# Patient Record
Sex: Female | Born: 1960 | Race: Black or African American | Hispanic: No | Marital: Married | Smoking: Former smoker
Health system: Southern US, Community
[De-identification: ages and names within clinical notes are randomized; demographics above are authoritative.]

## PROBLEM LIST (undated history)

## (undated) DIAGNOSIS — J45909 Unspecified asthma, uncomplicated: Secondary | ICD-10-CM

## (undated) DIAGNOSIS — T7840XA Allergy, unspecified, initial encounter: Secondary | ICD-10-CM

## (undated) DIAGNOSIS — A630 Anogenital (venereal) warts: Secondary | ICD-10-CM

## (undated) DIAGNOSIS — I10 Essential (primary) hypertension: Secondary | ICD-10-CM

## (undated) HISTORY — DX: Anogenital (venereal) warts: A63.0

## (undated) HISTORY — DX: Essential (primary) hypertension: I10

## (undated) HISTORY — DX: Allergy, unspecified, initial encounter: T78.40XA

## (undated) HISTORY — DX: Unspecified asthma, uncomplicated: J45.909

---

## 1985-01-17 HISTORY — PX: ABDOMINAL HYSTERECTOMY: SHX81

## 2013-10-01 LAB — HM MAMMOGRAPHY

## 2014-03-10 LAB — RHEUMATOID FACTOR
CRP MG/L: 1.1 (ref ?–4.9)
HEMOGLOBIN A1C: 6.1 — AB (ref ?–5.6)
RHEUMATOID ARTHRITIS FACTOR: 11.7 (ref ?–13.9)
SED RATE: 20 mm (ref ?–40)

## 2014-04-16 LAB — DG KNEE 1-2 VIEWS LEFT

## 2014-11-27 LAB — HM MAMMOGRAPHY

## 2014-12-27 LAB — HEPATITIS C ANTIBODY
Ferritin: 412 — AB (ref ?–150)
HBsAg Screen: NEGATIVE
Hep C Virus Ab: <0.1 (ref 0.0–0.9)

## 2014-12-27 LAB — HEPATITIS B SURF AG CONFIRMATION: HEP B SURFACE AB, QUAL: NONREACTIVE

## 2015-04-13 LAB — COMPREHENSIVE METABOLIC PANEL
A/G RATIO: 1.8
ALT: 14
AST: 16 U/L
Albumin: 4.9
Alkaline Phosphatase: 69 U/L
BUN/Creatinine Ratio: 14
BUN: 13
CALCIUM: 9.9 mg/dL
CREATININE: 0.93
Chloride: 102 mmol/L
GFR CALC AF AMER: 80
GFR CALC NON AF AMER: 69
GLOBULIN: 2.7
GLUCOSE: 96
POTASSIUM: 4.3 mmol/L
PROTEIN: 7.6
SODIUM: 143
Total Bilirubin: 0.2 mg/dL

## 2015-04-13 LAB — CHG COMPLETE CBC
HEMATOCRIT: 41 %
HEMOGLOBIN: 13.4
MCH: 27.6
MCHC: 32.6
MCV: 85
Platelets: 276
RBC: 4.86
RDW: 14.9
WBC: 4.6

## 2015-04-13 LAB — LIPID PANEL
Cholesterol, Total: 172
HDL: 59
LDL Cholesterol (Calc): 97
TRIGLYCERIDES: 82
VLDL: 16 mg/dL

## 2015-04-13 LAB — THYROID PANEL
T4,FREE (DIRECT): 1.33 (ref ?–1.77)
TSH: 0.926

## 2015-04-13 LAB — FERRITIN
CRP MG/L: 0.5 (ref ?–4.9)
Ferritin: 350 — AB (ref ?–150)
THYROID PEROXIDASE (TPO) AB: 19 (ref ?–34)

## 2015-04-13 LAB — VITAMIN D 25 HYDROXY (VIT D DEFICIENCY, FRACTURES): VIT D 25 HYDROXY: 23.1 — AB (ref 30–100)

## 2015-04-13 LAB — HEMOGLOBIN A1C: HEMOGLOBIN A1C: 5.7 — AB (ref ?–5.6)

## 2015-10-30 ENCOUNTER — Telehealth: Payer: Self-pay

## 2015-10-30 MED ORDER — LISINOPRIL 10 MG PO TABS: 10.0000 mg | ORAL_TABLET | Freq: Every day | ORAL | 0 refills | Status: DC

## 2015-10-30 MED ORDER — HYDROCHLOROTHIAZIDE 25 MG PO TABS: 25.0000 mg | ORAL_TABLET | Freq: Every day | ORAL | 0 refills | Status: DC

## 2015-10-30 NOTE — Telephone Encounter (Signed)
Okay I will send a 2 week supply to get her through her appt with Dr. Raliegh Ip.  Thanks! AK

## 2015-10-30 NOTE — Telephone Encounter (Signed)
Patient has appointment with Dr. Parks Ranger on 11/09/15. However, she will be out of her BP meds today. Lisinopril 10mg  1 daily HCTZ 25mg  1 daily Please send to [facility] in [place].

## 2015-11-09 ENCOUNTER — Ambulatory Visit (INDEPENDENT_AMBULATORY_CARE_PROVIDER_SITE_OTHER): Payer: BC Managed Care – PPO | Admitting: Family Medicine

## 2015-11-09 ENCOUNTER — Encounter: Payer: Self-pay | Admitting: Family Medicine

## 2015-11-09 VITALS — BP 132/78 | HR 82 | Temp 98.3°F | Resp 16 | Ht 64.0 in | Wt 135.4 lb

## 2015-11-09 DIAGNOSIS — N951 Menopausal and female climacteric states: Secondary | ICD-10-CM | POA: Diagnosis not present

## 2015-11-09 DIAGNOSIS — M19012 Primary osteoarthritis, left shoulder: Secondary | ICD-10-CM | POA: Diagnosis not present

## 2015-11-09 DIAGNOSIS — M25562 Pain in left knee: Secondary | ICD-10-CM

## 2015-11-09 DIAGNOSIS — G8929 Other chronic pain: Secondary | ICD-10-CM | POA: Insufficient documentation

## 2015-11-09 DIAGNOSIS — I10 Essential (primary) hypertension: Secondary | ICD-10-CM | POA: Insufficient documentation

## 2015-11-09 DIAGNOSIS — Z1211 Encounter for screening for malignant neoplasm of colon: Secondary | ICD-10-CM

## 2015-11-09 DIAGNOSIS — R7303 Prediabetes: Secondary | ICD-10-CM | POA: Diagnosis not present

## 2015-11-09 DIAGNOSIS — J4520 Mild intermittent asthma, uncomplicated: Secondary | ICD-10-CM

## 2015-11-09 DIAGNOSIS — J45909 Unspecified asthma, uncomplicated: Secondary | ICD-10-CM | POA: Insufficient documentation

## 2015-11-09 DIAGNOSIS — J3089 Other allergic rhinitis: Secondary | ICD-10-CM | POA: Diagnosis not present

## 2015-11-09 DIAGNOSIS — M19011 Primary osteoarthritis, right shoulder: Secondary | ICD-10-CM | POA: Diagnosis not present

## 2015-11-09 DIAGNOSIS — Z87891 Personal history of nicotine dependence: Secondary | ICD-10-CM

## 2015-11-09 DIAGNOSIS — Z7689 Persons encountering health services in other specified circumstances: Secondary | ICD-10-CM | POA: Diagnosis not present

## 2015-11-09 MED ORDER — HYDROCHLOROTHIAZIDE 25 MG PO TABS: 25.0000 mg | ORAL_TABLET | Freq: Every day | ORAL | 3 refills | Status: DC

## 2015-11-09 MED ORDER — LISINOPRIL 20 MG PO TABS: 20.0000 mg | ORAL_TABLET | Freq: Every day | ORAL | 3 refills | Status: DC

## 2015-11-09 NOTE — Patient Instructions (Signed)
Thank you for coming in to clinic today.  1. BP is mildly elevated initially, then improved on manual re-check. - As discussed, it is reasonable to increase Lisinopril to 40m daily, sent rx and HCTZ rx to pharmacy - Keep checking BP at home and record readings, if it is persistently too LOW < 100/70 and you feel lightheaded or dizzy, go ahead and cut lisinopril in half, and notify [place] If BP is still elevated >140/90, consistently for several days to week, let [place], and we will have you follow-up to discuss an alternative BP med change  [facility]  You do not have an ear infection. Possibly some small amount of fluid behind R ear drum, this could be due to sinus symptoms with your allergies. - Keep taking Cetirizine or Zyrtec 137mdaily every day - As discussed, if you dont want to try the Flonase or Nasal saline, then there are limited treatments - You can try the manual sinus effleurage with warm wash cloth as demonstrated  For Left Knee, most likely the "crackling" is called Crepitus and comes from wear and tear of the cartilage under knee cap. If this becomes a problem with persistent pain, swelling, difficulty walking, locking in place and causing falls, then please return sooner, otherwise as it is now, we do not need x-rays. - Keep up good work with Knee Sleeve compression - You may use ice packs if swelling, keep elevated (above heart level for best result) - Avoid over activity and limit time on your feet if flare up - Tylenol is first line safest arthritis medication Recommend to start taking Tylenol Extra Strength 50059mabs - take 1 to 2 tabs (max 1000m49mr dose) every 6 hours for pain (take regularly, don't skip a dose for next 3-7 days), max 24 hour daily dose is 6 to 8 tablets or 3000 to 4000mg58mhis is safe to take with Ibuprofen or Aleve or similar medications - If significant flare up. You may take Ibuprofen 400-600mg 12meeded every 8  hours for several days to week if needed OR Aleve 1-2 tabs every 12 hours with food for 1-2 weeks then STOP  Colon Cancer Screening: - For all adults age 12+ ro73+ne colon cancer screening is highly recommended. - Early detection of colon cancer is important, because often there are no warning signs or symptoms, also if found early usually it can be cured. Late stage is hard to treat.  - If you are not interested in Colonoscopy screening (if done and normal you could be cleared for 5 to 10 years until next due), then Cologuard is an excellent alternative for screening test for Colon Cancer. It is highly sensitive for detecting DNA of colon cancer from even the earliest stages. Also, there is NO bowel prep required. - If Cologuard is NEGATIVE, then it is good for 3 years before next due - If Cologuard is POSITIVE, then it is strongly advised to get a Colonoscopy, which allows the GI doctor to locate the source of the cancer or polyp (even very early stage) and treat it by removing it. ------------------------- If you would like to proceed with Cologuard (stool DNA test) - FIRST, call your insurance company and tell them you want to check cost of Cologuard tell them CPT Code 81528 [phone] be completely covered and you could get for no cost, OR max cost without any coverage is about $600). Also, keep in mind if you do  NOT open the kit, and decide not to do the test, you will NOT be charged, you should contact the company if you decide not to do the test. - If you want to proceed, you can notify us (phone message, [place] [facility], or at next visit) and we will order it for you. The test kit will be delivered to you house within about 1 week. Follow instructions to collect sample, you may call the company for any help or questions, 24/7 telephone support at [phone].  Please schedule a follow-up appointment with Dr. Parks Ranger in 3 to 6 months to follow-up Annual Physical and will need fasting  labs.  If you have any other questions or concerns, please feel free to call the clinic or send a message through [place]. You may also schedule an earlier appointment if necessary.  Nobie Putnam, DO [place]

## 2015-11-09 NOTE — Progress Notes (Signed)
Subjective:    Patient ID: Regina Barber, female    DOB: 1960/02/19, 55 y.o.   MRN: [id number]  Regina Barber is a 55 y.o. female presenting on 11/09/2015 for [facility] (main concerned shoulder pain and L knee pain )  [facility], previously seen about 4-5 months ago, due to insurance change with [employer].  HPI  CHRONIC HTN: Reports has been on anti-HTN regimen over past about 15 years. Does not check BP regularly Current Meds - HCTZ 25mg , Lisinopril 10mg  (was previously on higher dose, has been reduced over past 5 years) - got refill for 2 week supply until apt today, has not run out Reports good compliance, took meds today. Tolerating well, w/o complaints. Lifestyle - Tries to adhere to healthy diet with limited fried food, eats salads, eats a lot of fresh fish, no salt added. 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  Asthma / Seasonal Allergies: - Chronic problem with asthma, since age 58s, thought to be triggered due to environmental situation many years ago, with housing and basement with mold and other allergens - Using Albuterol only as needed, about 1x yearly, infrequent, now triggers gone, only needs with viral URI or bronchitis. No other asthma meds - Taking Cetirizine 10mg  daily, was taking Benadryl as needed recently for possible R ear infection over past 3 months with intermittent symptoms with some occasional sharp pains, with a fullness and pressure - Not interested in nasal sprays flonase or saline, given prior history of substance use, does not use any medicines in her nose  Postmenopausal / S/p Hysterectomy - S/p hysterectomy in 1987, and spared ovaries, she has experience hormonal menopausal symptoms with hot  flashes over past few years, gradually improving, she is asking about hormonal replacement therapy as this was advised to her back in 1987.  Left Knee Popping / Intermittent Pain / SHOULDER ARTHRITIS: - Reports known history of some Arthritis by prior reports, no significant shoulder injury or problem, no recent imaging. Also reports problem of Left knee intermittent brief painful episodes, usually behind knee cap worse when stand from prolonged sitting or stairs, will hear "popping and crackling" when bends knee, no prior imaging or treatment - Improved with knee sleeve and some rest - Denies acute knee injury, fall, trauma, erythema, ecchymosis, edema or effusion  FORMER SMOKER: - Quit smoking >9 years ago when daughter was born, quit on own without medication, prior history 1ppd >30 year smoking, onset < 18 years. History of mild asthma, but denies significant lung disease due to smoking.  Additional Social History: - Moved from [place] 5 years ago  Health Maintenance: - S/p hysterectomy, reportedly removed cervix, not interested in pap smear - Due for Flu shot, declined today, counseled on risks / benefits, recommended - UTD on TDap - Due for colonoscopy, never had, interested in cologuard initial testing - Due for routine HIV and Hep C screening, will wait until blood draw - Last Mammogram 2016, request info for next   Past Medical History:  Diagnosis Date  . Allergy   . Asthma   . Genital warts   . Hypertension    Social History   Social History  . Marital status: Married    Spouse name: N/A  . Number  of children: 2  . Years of education: Associate's Degree   Occupational History  . School Custodian    Social History Main Topics  . Smoking status: Former Smoker    Packs/day: 1.00    Years: 30.00    Types: Cigarettes    Quit date: 2008  . Smokeless tobacco: Former Systems developer     Comment: Quit on own after birth of daughter  . Alcohol use No  . Drug use: No      Comment: Former history of substance use  . Sexual activity: Not on file   Other Topics Concern  . Not on file   Social History Narrative  . No narrative on file   Family History  Problem Relation Age of Onset  . Cirrhosis Father 45   No current outpatient prescriptions on file prior to visit.   No current facility-administered medications on file prior to visit.     Review of Systems  Constitutional: Negative for activity change, appetite change, chills, diaphoresis, fatigue and fever.  HENT: Negative for congestion, hearing loss and sinus pressure.   Eyes: Negative for visual disturbance.  Respiratory: Negative for cough, chest tightness, shortness of breath and wheezing.   Cardiovascular: Negative for chest pain, palpitations and leg swelling.  Gastrointestinal: Negative for abdominal pain, anal bleeding, blood in stool, constipation, diarrhea, nausea and vomiting.  Endocrine: Negative for cold intolerance, polydipsia and polyuria.  Genitourinary: Negative for dysuria, frequency and hematuria.  Musculoskeletal: Negative for arthralgias, back pain and neck pain.  Skin: Negative for rash.  Allergic/Immunologic: Positive for environmental allergies.  Neurological: Negative for dizziness, weakness, light-headedness, numbness and headaches.  Hematological: Negative for adenopathy.  Psychiatric/Behavioral: Negative for behavioral problems, dysphoric mood and sleep disturbance.   Per HPI unless specifically indicated above     Objective:    BP 132/78 (BP Location: Right Arm, Cuff Size: Normal)   Pulse 82   Temp 98.3 F (36.8 C) (Oral)   Resp 16   Ht 5\' 4"  (1.626 m)   Wt 135 lb 6.4 oz (61.4 kg)   BMI 23.24 kg/m   Wt Readings from Last 3 Encounters:  11/09/15 135 lb 6.4 oz (61.4 kg)    Physical Exam  Constitutional: She is oriented to person, place, and time. She appears well-developed and well-nourished. No distress.  Well-appearing, comfortable, cooperative  HENT:    Head: Normocephalic and atraumatic.  Mouth/Throat: Oropharynx is clear and moist.  Frontal / maxillary sinuses non-tender. Nares patent without purulence or edema. Right TM with very minimal erythema without sign of infection, minimal clear effusion, compared to Left, otherwise normal landmarks, no bulging. Oropharynx clear without erythema, exudates, edema or asymmetry.  Eyes: Conjunctivae and EOM are normal. Pupils are equal, round, and reactive to light.  Neck: Normal range of motion. Neck supple. No thyromegaly present.  Cardiovascular: Normal rate, regular rhythm, normal heart sounds and intact distal pulses.   No murmur heard. Pulmonary/Chest: Effort normal and breath sounds normal. No respiratory distress. She has no wheezes. She has no rales.  Abdominal: Soft. She exhibits no distension.  Musculoskeletal: Normal range of motion. She exhibits no edema or tenderness.  Back normal without deformity or abnormal curvature.  Upper / Lower Extremities: - Normal muscle tone, strength bilateral upper extremities 5/5, lower extremities 5/5 - Bilateral shoulders, knees, wrist, ankles without deformity, tenderness, effusion  Left Knee Full active ROM, normal appearance, non tender today, +significant crepitus on ROM, even audible, no effusion or ecchymosis.  Left elbow forearm Non-tender, no  ecchymosis or erythema, no edema  - Normal Gait  Lymphadenopathy:    She has no cervical adenopathy.  Neurological: She is alert and oriented to person, place, and time.  Skin: Skin is warm and dry. No rash noted. She is not diaphoretic.  Psychiatric: She has a normal mood and affect. Her behavior is normal.  Nursing note and vitals reviewed.  No results found for this or any previous visit.    Assessment & Plan:   Problem List Items Addressed This Visit    Pre-diabetes    By report, without lab value today, awaiting records. - Follow-up 3-6 months for annual physical, will check A1c, labs       Hypertension    Mildly elevated HTN, improved on manual re-check but report of consistent mild elevated for while, was previously on higher dose medication did better No complications   Plan:  1. Increase Lisinopril from 10mg  daily to 20mg  daily, and continue HCTZ 25mg  daily, refills #90 day supply sent in 2. Check CMET labs at next physical 6 mo 3. Remain smoke free, improve diet low sodium inc fresh vegetables 4. Monitor BP at home 5. F/u 6 mo      Relevant Medications   aspirin EC 81 MG tablet   hydrochlorothiazide (HYDRODIURIL) 25 MG tablet   lisinopril (PRINIVIL,ZESTRIL) 20 MG tablet   Hot flash, menopausal    Improving, chronic problem recently S/p hysterectomy, but ovaries spared Discussion on why hormonal replacement therapy would not be indicated, given significant risks of ASCVD      Former smoker    Quit >9 years ago, approx 1ppd >30 years Remain smoke free, follow-up future CT chest screening lung CA if interested      Environmental and seasonal allergies    Stable, with some intermittent R>L ear symptoms possible eustachian tube dysfunction and rhinosinusitis - Contineu Zyrtec daily - Offered flonase and nasal saline, declined, does not use nose sprays with prior history of substance use - Demonstrated sinus effleurage      Colon cancer screening    Due for routine colon cancer screening. Never had colonoscopy, no family history colon cancer. - Discussion today about recommendations for either Colonoscopy or Cologuard screening, benefits and risks of screening, interested in Cologuard, understands that if positive then recommendation is for diagnostic colonoscopy to follow-up. 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 No injury, mechanical symptoms, effusion.  Plan: 1. Reassurance, continue conservative therapy 2. RICE, may use Tylenol regularly and PRN NSAID 3. No imaging due 4. Follow-up if not improved, consider exercises      Relevant Medications   aspirin EC 81 MG tablet   Bilateral shoulder region arthritis    Stable chronic problem No recent flare No regular conservative therapy, may take Tylenol / NSAIDs prn      Relevant Medications   aspirin EC 81 MG tablet   Asthma    Stable, well controlled mild intermittent asthma without exac - Prior history worse onset due to environmental / mold allergies, now new living situation mostly resolved. Only trigger viral URI. No hospitalizations or ED or prednisone.  Plan: 1. Continue Albuterol PRN, very infrequent 2. No maintenance therapy needed at this time 3. Follow-up if worsening  Relevant Medications   albuterol (PROVENTIL HFA;VENTOLIN HFA) 108 (90 Base) MCG/ACT inhaler    Other Visit Diagnoses    Encounter to establish care with new doctor    -  Primary      Meds ordered this encounter  Medications  . DISCONTD: diphenhydrAMINE (BENADRYL) 25 MG tablet    Sig: Take 25 mg by mouth every 6 (six) hours as needed.  . cetirizine (ZYRTEC) 10 MG tablet    Sig: Take 10 mg by mouth daily.  Marland Kitchen albuterol (PROVENTIL HFA;VENTOLIN HFA) 108 (90 Base) MCG/ACT inhaler    Sig: Inhale into the lungs every 6 (six) hours as needed for wheezing or shortness of breath.  Marland Kitchen aspirin EC 81 MG tablet    Sig: Take 81 mg by mouth daily.  . hydrochlorothiazide (HYDRODIURIL) 25 MG tablet    Sig: Take 1 tablet (25 mg total) by mouth daily.    Dispense:  90 tablet    Refill:  3  . lisinopril (PRINIVIL,ZESTRIL) 20 MG tablet    Sig: Take 1 tablet (20 mg total) by mouth daily.    Dispense:  90 tablet    Refill:  3      Follow up plan: Return in about 6 months (around 05/09/2016) for Annual physical, labs.  Nobie Putnam, [facility] 11/10/2015, 8:01 AM

## 2015-11-10 ENCOUNTER — Encounter: Payer: Self-pay | Admitting: Family Medicine

## 2015-11-10 DIAGNOSIS — N951 Menopausal and female climacteric states: Secondary | ICD-10-CM | POA: Insufficient documentation

## 2015-11-10 DIAGNOSIS — Z87891 Personal history of nicotine dependence: Secondary | ICD-10-CM | POA: Insufficient documentation

## 2015-11-10 NOTE — Assessment & Plan Note (Signed)
Stable chronic problem No recent flare No regular conservative therapy, may take Tylenol / NSAIDs prn

## 2015-11-10 NOTE — Assessment & Plan Note (Signed)
Quit >9 years ago, approx 1ppd >30 years Remain smoke free, follow-up future CT chest screening lung CA if interested

## 2015-11-10 NOTE — Assessment & Plan Note (Signed)
Stable, with some intermittent R>L ear symptoms possible eustachian tube dysfunction and rhinosinusitis - Contineu Zyrtec daily - Offered flonase and nasal saline, declined, does not use nose sprays with prior history of substance use - Demonstrated sinus effleurage

## 2015-11-10 NOTE — Assessment & Plan Note (Signed)
Due for routine colon cancer screening. Never had colonoscopy, no family history colon cancer. - Discussion today about recommendations for either Colonoscopy or Cologuard screening, benefits and risks of screening, interested in Cologuard, understands that if positive then recommendation is for diagnostic colonoscopy to follow-up. - Patient advised to contact insurance first to learn cost, will notify us when ready for [place] to order Cologuard

## 2015-11-10 NOTE — Assessment & Plan Note (Signed)
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 No injury, mechanical symptoms, effusion.  Plan: 1. Reassurance, continue conservative therapy 2. RICE, may use Tylenol regularly and PRN NSAID 3. No imaging due 4. Follow-up if not improved, consider exercises

## 2015-11-10 NOTE — Assessment & Plan Note (Signed)
Stable, well controlled mild intermittent asthma without exac - Prior history worse onset due to environmental / mold allergies, now new living situation mostly resolved. Only trigger viral URI. No hospitalizations or ED or prednisone.  Plan: 1. Continue Albuterol PRN, very infrequent 2. No maintenance therapy needed at this time 3. Follow-up if worsening

## 2015-11-10 NOTE — Assessment & Plan Note (Signed)
Improving, chronic problem recently S/p hysterectomy, but ovaries spared Discussion on why hormonal replacement therapy would not be indicated, given significant risks of ASCVD

## 2015-11-10 NOTE — Assessment & Plan Note (Signed)
By report, without lab value today, awaiting records. - Follow-up 3-6 months for annual physical, will check A1c, labs

## 2015-11-10 NOTE — Assessment & Plan Note (Signed)
Mildly elevated HTN, improved on manual re-check but report of consistent mild elevated for while, was previously on higher dose medication did better No complications   Plan:  1. Increase Lisinopril from 10mg  daily to 20mg  daily, and continue HCTZ 25mg  daily, refills #90 day supply sent in 2. Check CMET labs at next physical 6 mo 3. Remain smoke free, improve diet low sodium inc fresh vegetables 4. Monitor BP at home 5. F/u 6 mo

## 2016-05-09 ENCOUNTER — Encounter: Payer: Self-pay | Admitting: Family Medicine

## 2016-05-09 ENCOUNTER — Ambulatory Visit (INDEPENDENT_AMBULATORY_CARE_PROVIDER_SITE_OTHER): Payer: BC Managed Care – PPO | Admitting: Family Medicine

## 2016-05-09 VITALS — BP 117/68 | HR 85 | Temp 98.5°F | Resp 16 | Ht 64.0 in | Wt 136.6 lb

## 2016-05-09 DIAGNOSIS — M150 Primary generalized (osteo)arthritis: Secondary | ICD-10-CM | POA: Diagnosis not present

## 2016-05-09 DIAGNOSIS — Z1211 Encounter for screening for malignant neoplasm of colon: Secondary | ICD-10-CM

## 2016-05-09 DIAGNOSIS — R7303 Prediabetes: Secondary | ICD-10-CM | POA: Diagnosis not present

## 2016-05-09 DIAGNOSIS — Z0000 Encounter for general adult medical examination without abnormal findings: Secondary | ICD-10-CM | POA: Diagnosis not present

## 2016-05-09 DIAGNOSIS — I10 Essential (primary) hypertension: Secondary | ICD-10-CM

## 2016-05-09 DIAGNOSIS — M654 Radial styloid tenosynovitis [de Quervain]: Secondary | ICD-10-CM | POA: Diagnosis not present

## 2016-05-09 DIAGNOSIS — M159 Polyosteoarthritis, unspecified: Secondary | ICD-10-CM | POA: Insufficient documentation

## 2016-05-09 NOTE — Assessment & Plan Note (Signed)
Stable, without flare Multiple joints, shoulder, knees L>R, hands - Reviewed conservative therapy, Tylenol dosing - Follow-up as needed

## 2016-05-09 NOTE — Progress Notes (Signed)
Subjective:    Patient ID: Regina Barber, female    DOB: [birth year], 56 y.o.   MRN: [id number]  Regina Barber is a 56 y.o. female presenting on 05/09/2016 for Annual Exam (pt had hysterectomy don't need papsmear)  HPI  CHRONIC HTN: Reports doing well since last visit back on meds. Does not check BP regularly anymore, but will get new BP cuff and check 2x weekly Current Meds - HCTZ 25mg , Lisinopril 20mg  (increased from last 10mg ) Reports good compliance, took meds today. Tolerating well, w/o complaints. Lifestyle - continues to improve healthy diet, no salt added. Has not implemented regular exercise. Stays active working as custodian  History of Borderline Elevated / Pre-DM - Did not request labs prior to visit today, does not have lab results to review, never obtained old records from [facility], do not have prior A1c or lipid results  Osteoarthritis, Multiple Joints / Shoulder, Left Knee, Hands (R De Quervain's Tenosynvoitis) - History of prior arthritis in multiple joints, has intermittent mild flares at times, has had problems with L knee and shoulders before. - Today reports has had problem recently with Right thumb, and Left hand, describes worse with repetitive activities as custodian, Right handed. Worse with excessive gripping. Has tried a soft wrist splint support before, occasional Tylenol dose, infrequent. Now R thumb is improved, seems to be intermittent problem. - Denies locking or triggering of finger - Denies joint injury, fall, trauma, erythema, ecchymosis, edema or effusion  Health Maintenance: - S/p hysterectomy, reportedly removed cervix, not interested in pap smear - UTD on TDap - Due for colonoscopy, never had prior colonoscopy, would like referral now, no known family history of colon cancer, mother had poylps. Denies any changes in bowel habits, see negative ROS. - Due for routine HIV and Hep C screening, but declines this time suspects she had  this done from prior PCP, awaiting records - Last Mammogram 12/2015, negative, through [facility], repeat yearly   Past Medical History:  Diagnosis Date  . Allergy   . Asthma   . Genital warts   . Hypertension    Social History   Social History  . Marital status: Married    Spouse name: N/A  . Number of children: 2  . Years of education: Associate's Degree   Occupational History  . School Custodian    Social History Main Topics  . Smoking status: Former Smoker    Packs/day: 1.00    Years: 30.00    Types: Cigarettes    Quit date: 2008  . Smokeless tobacco: Former Systems developer     Comment: Quit on own after birth of daughter  . Alcohol use No  . Drug use: No     Comment: Former history of substance use  . Sexual activity: Not on file   Other Topics Concern  . Not on file   Social History Narrative  . No narrative on file   Family History  Problem Relation Age of Onset  . Cirrhosis Father 94   Current Outpatient Prescriptions on File Prior to Visit  Medication Sig  . albuterol (PROVENTIL HFA;VENTOLIN HFA) 108 (90 Base) MCG/ACT inhaler Inhale into the lungs every 6 (six) hours as needed for wheezing or shortness of breath.  Marland Kitchen aspirin EC 81 MG tablet Take 81 mg by mouth daily.  . cetirizine (ZYRTEC) 10 MG tablet Take 10 mg by mouth daily.  . hydrochlorothiazide (HYDRODIURIL) 25 MG tablet Take 1 tablet (25 mg total) by mouth daily.  Marland Kitchen  lisinopril (PRINIVIL,ZESTRIL) 20 MG tablet Take 1 tablet (20 mg total) by mouth daily.   No current facility-administered medications on file prior to visit.     Review of Systems  Constitutional: Negative for activity change, appetite change, chills, diaphoresis, fatigue and fever.  HENT: Negative for congestion, hearing loss and sinus pressure.   Eyes: Negative for visual disturbance.  Respiratory: Negative for cough, chest tightness, shortness of breath and wheezing.   Cardiovascular: Negative for chest pain, palpitations and leg  swelling.  Gastrointestinal: Negative for abdominal pain, anal bleeding, blood in stool, constipation, diarrhea, nausea and vomiting.  Endocrine: Negative for cold intolerance, polydipsia and polyuria.  Genitourinary: Negative for dysuria, frequency and hematuria.  Musculoskeletal: Negative for arthralgias, back pain and neck pain.  Skin: Negative for rash.  Allergic/Immunologic: Positive for environmental allergies.  Neurological: Negative for dizziness, weakness, light-headedness, numbness and headaches.  Hematological: Negative for adenopathy.  Psychiatric/Behavioral: Negative for behavioral problems, dysphoric mood and sleep disturbance.   Per HPI unless specifically indicated above     Objective:    BP 117/68   Pulse 85   Temp 98.5 F (36.9 C) (Oral)   Resp 16   Ht 5\' 4"  (1.626 m)   Wt 136 lb 9.6 oz (62 kg)   BMI 23.45 kg/m   Wt Readings from Last 3 Encounters:  05/09/16 136 lb 9.6 oz (62 kg)  11/09/15 135 lb 6.4 oz (61.4 kg)    Physical Exam  Constitutional: She is oriented to person, place, and time. She appears well-developed and well-nourished. No distress.  Well-appearing, comfortable, cooperative  HENT:  Head: Normocephalic and atraumatic.  Mouth/Throat: Oropharynx is clear and moist.  Nares patent without purulence or edema. Right TM still with minimal erythema without sign of infection. Left with mild clear effusion Left, otherwise normal landmarks, no bulging. Oropharynx clear without erythema, exudates, edema or asymmetry.  Eyes: Conjunctivae and EOM are normal. Pupils are equal, round, and reactive to light.  Neck: Normal range of motion. Neck supple. No thyromegaly present.  No carotid bruits  Cardiovascular: Normal rate, regular rhythm, normal heart sounds and intact distal pulses.   No murmur heard. Pulmonary/Chest: Effort normal and breath sounds normal. No respiratory distress. She has no wheezes. She has no rales.  Abdominal: Soft. Bowel sounds are  normal. She exhibits no distension and no mass. There is no tenderness.  Musculoskeletal: Normal range of motion. She exhibits no edema or tenderness.  Bilateral Hand/Wrist Inspection: Normal appearance, symmetrical, no bulky MCP joints, no edema or erythema. Palpation:  R Hand - Non tender hand / wrist, carpal bones, including MCP, base of thumb. No distinct anatomical snuff box or scaphoid tenderness. Mild tenderness over APL / EPB tendons radially only on deeper palpation  L Hand - Mild tender 3rd digit MCP joint and compression of metacarpals. Non tender hand / wrist, base of thumb. No distinct anatomical snuff box or scaphoid tenderness.  ROM: full active wrist ROM flex / ext, ulnar / radial deviation Special Testing: Right Finkelstein's test positive with mild pain. Negative Tinel's median nerve bilateral Strength: 5/5 grip, thumb opposition, wrist flex/ext Neurovascular: distally intact  Lymphadenopathy:    She has no cervical adenopathy.  Neurological: She is alert and oriented to person, place, and time.  Skin: Skin is warm and dry. No rash noted. She is not diaphoretic.  Psychiatric: She has a normal mood and affect. Her behavior is normal.  Well groomed, good eye contact, normal speech and thoughts  Nursing note and  vitals reviewed.  No results found for this or any previous visit.    Assessment & Plan:   Problem List Items Addressed This Visit    Pre-diabetes    By report prior Pre-DM, no A1c lab Check A1c this week      Relevant Orders   Hemoglobin A1c   Osteoarthritis of multiple joints    Stable, without flare Multiple joints, shoulder, knees L>R, hands - Reviewed conservative therapy, Tylenol dosing - Follow-up as needed      Hypertension    Improved control HTN No complications - awaiting upcoming labs, no baseline Cr  Plan: 1. Continue current Lisinopril 20mg  daily, HCTZ 25mg  daily 2. Ordered CMET for baseline labs, not done before physical, should get  drawn within 1 week 3. Continue current lifestyle - low sodium diet, improve regular exercise 4. Start to monitor BP outside office x2 weekly, record readings 5. Follow-up 6 months      Relevant Orders   COMPLETE METABOLIC PANEL WITH GFR   De Quervain's tenosynovitis, right    Suspected Right DeQuervain's tenosynovitis, now controlled without flare - Likely problem in setting of repetitive overuse at work as custodian  Plan: 1. Reassurance and activity modification 2. If future flare discussed OTC ALeve NSAID dosing, Tylenol PRN, relative rest, use of R thumb spica splint 3. Follow-up as needed      Colon cancer screening    Due for 1st routine screening colonoscopy - No family history of colon CA, mother with polyps - Denies any GI red flag symptoms  Plan: 1. Referral to [facility] for Colonoscopy      Relevant Orders   Ambulatory referral to Gastroenterology    Other Visit Diagnoses    Annual physical exam    -  Primary   Relevant Orders   COMPLETE METABOLIC PANEL WITH GFR   Hemoglobin A1c   Lipid panel   CBC with Differential/Platelet      No orders of the defined types were placed in this encounter.   Follow up plan: Return in about 6 months (around 11/08/2016) for 6 month follow-up HTN, Arthritis.  Nobie Putnam, [facility] 05/09/2016, 9:30 PM

## 2016-05-09 NOTE — Assessment & Plan Note (Signed)
By report prior Pre-DM, no A1c lab Check A1c this week

## 2016-05-09 NOTE — Assessment & Plan Note (Signed)
Due for 1st routine screening colonoscopy - No family history of colon CA, mother with polyps - Denies any GI red flag symptoms  Plan: 1. Referral to [facility] for Colonoscopy

## 2016-05-09 NOTE — Assessment & Plan Note (Signed)
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 Reassurance and activity modification 2. If future flare discussed OTC ALeve NSAID dosing, Tylenol PRN, relative rest, use of R thumb spica splint 3. Follow-up as needed

## 2016-05-09 NOTE — Patient Instructions (Signed)
Thank you for coming in to clinic today  1.  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  - This is a type of tendonitis involving the tendons from the thumb into the wrist, it is a very common spot for inflammation and pain, usually caused by repetitive activities (lifting, writing, typing, carrying, worse with heavier objective or more repetitive strain) - Once it is flared up it can continue to persist for days to weeks due to inflammation, and mostly importantly needs rest and time to heal - Recommend trial of Anti-inflammatory with Aleve OTC (Naproxen generic) 220 or 250mg  tabs - take ONE-TWO with food and plenty of water TWICE daily every day (breakfast and dinner), for next 2 weeks, then you may take only as needed - DO NOT TAKE any ibuprofen, motrin while you are taking this medicine - It is safe to take Tylenol Ext Str 500mg  tabs - take 1 to 2 (max dose 1000mg ) every 6-8 hours (3 times daily) as needed for breakthrough pain, max 24 hour daily dose is 6 or 3000mg  - Wrist splint will provide support to the tendons and reduce strain - Purchase a THUMB SPICA SPLINT (to limit thumb and wrist flexion) - REST is extremely important, the goal is to AVOID re-injury, try to modify activities - Also may try ice packs if swelling, or topical icy hot muscle rub can also help ease worsening pain temporarily  If you get significant worsening pain, weakness in your hand (not due to pain), numbness, tingling, burning, more constant without activity, then follow-up sooner as we may need to consider stronger anti-inflammatory with prednisone, or referral for  injection.  LEFT HAND avoid repetitive grip motions  ------------------------------------------------------- Referral to GI for Colonoscopy - stay tuned for an appointment, if you don't hear back in 2 weeks then call them to find out scheduling  [facility] ([facility]) [address], [place] [address] Phone: [phone]  Jonathon Bellows, MD  You will be due for [place] (no food or drink after midnight before, only water or coffee without cream/sugar on the morning of)  - Please go ahead and schedule a "Lab Only" visit in the morning at the clinic for lab draw within 1 week - Make sure Lab Only appointment is at least 1-2 weeks before your next appointment, so that results will be available  For Lab Results, once available within 2-3 days of blood draw, you can can log in to MyChart online to view your results and a brief explanation. Also, we can discuss results at next follow-up visit.  Please schedule a follow-up appointment with Dr. Parks Ranger in 6 month follow-up HTN, Arthritis  If you have any other questions or concerns, please feel free to call the clinic or send a message through [place]. You may also schedule an earlier appointment if necessary.  Nobie Putnam, DO [place]

## 2016-05-09 NOTE — Assessment & Plan Note (Signed)
Improved control HTN No complications - awaiting upcoming labs, no baseline Cr  Plan: 1. Continue current Lisinopril 20mg  daily, HCTZ 25mg  daily 2. Ordered CMET for baseline labs, not done before physical, should get drawn within 1 week 3. Continue current lifestyle - low sodium diet, improve regular exercise 4. Start to monitor BP outside office x2 weekly, record readings 5. Follow-up 6 months

## 2016-05-11 ENCOUNTER — Other Ambulatory Visit: Payer: BC Managed Care – PPO

## 2016-05-11 LAB — CBC WITH DIFFERENTIAL/PLATELET
BASOS PCT: 0 %
Basophils Absolute: 0 cells/uL (ref 0–200)
Eosinophils Absolute: 210 cells/uL (ref 15–500)
Eosinophils Relative: 5 %
HEMATOCRIT: 39.8 % (ref 35.0–45.0)
Hemoglobin: 12.9 g/dL (ref 11.7–15.5)
LYMPHS PCT: 38 %
Lymphs Abs: 1596 cells/uL (ref 850–3900)
MCH: 27.3 pg (ref 27.0–33.0)
MCHC: 32.4 g/dL (ref 32.0–36.0)
MCV: 84.1 fL (ref 80.0–100.0)
MONO ABS: 378 {cells}/uL (ref 200–950)
MPV: 10.8 fL (ref 7.5–12.5)
Monocytes Relative: 9 %
Neutro Abs: 2016 cells/uL (ref 1500–7800)
Neutrophils Relative %: 48 %
Platelets: 279 10*3/uL (ref 140–400)
RBC: 4.73 MIL/uL (ref 3.80–5.10)
RDW: 15.2 % — AB (ref 11.0–15.0)
WBC: 4.2 10*3/uL (ref 3.8–10.8)

## 2016-05-12 LAB — COMPLETE METABOLIC PANEL WITH GFR
ALBUMIN: 4.6 g/dL (ref 3.6–5.1)
ALK PHOS: 59 U/L (ref 33–130)
ALT: 14 U/L (ref 6–29)
AST: 15 U/L (ref 10–35)
BILIRUBIN TOTAL: 0.3 mg/dL (ref 0.2–1.2)
BUN: 20 mg/dL (ref 7–25)
CO2: 25 mmol/L (ref 20–31)
Calcium: 9.6 mg/dL (ref 8.6–10.4)
Chloride: 102 mmol/L (ref 98–110)
Creat: 0.88 mg/dL (ref 0.50–1.05)
GFR, EST AFRICAN AMERICAN: 85 mL/min (ref >=60)
GFR, EST NON AFRICAN AMERICAN: 74 mL/min (ref >=60)
GLUCOSE: 96 mg/dL (ref 65–99)
Potassium: 3.8 mmol/L (ref 3.5–5.3)
SODIUM: 140 mmol/L (ref 135–146)
TOTAL PROTEIN: 7.2 g/dL (ref 6.1–8.1)

## 2016-05-12 LAB — HEMOGLOBIN A1C
Hgb A1c MFr Bld: 5.7 % — ABNORMAL HIGH (ref <5.7)
Mean Plasma Glucose: 117 mg/dL

## 2016-05-12 LAB — LIPID PANEL
CHOL/HDL RATIO: 3.5 ratio (ref <5.0)
CHOLESTEROL: 181 mg/dL (ref <200)
HDL: 52 mg/dL (ref >50)
LDL Cholesterol: 112 mg/dL — ABNORMAL HIGH (ref <100)
Triglycerides: 87 mg/dL (ref <150)
VLDL: 17 mg/dL (ref <30)

## 2016-05-17 ENCOUNTER — Telehealth: Payer: Self-pay

## 2016-05-17 NOTE — Telephone Encounter (Signed)
Patient advised as below. Patient verbalizes understanding and is in agreement with treatment plan.  

## 2016-05-17 NOTE — Telephone Encounter (Signed)
-----   Message from Olin Hauser, DO sent at 05/17/2016 10:27 AM EDT ----- Please contact patient to review the following (No MyChart Access), labs from recent Annual Physical 04/2016.  1. Chemistry - Normal results, including kidney and liver function. Blood sugar normal.  2. Hemoglobin A1c (Diabetes screening) - 5.7, elevated reading, this is in range of Pre-Diabetes (5.7 to 6.4), we can re-check this in the office in October 2018.  3. Cholesterol - Normal cholesterol results except mildly elevated LDL (bad cholesterol). Based on risk calculation you do not meet criteria to start Statin Cholesterol medication at this time.  4. CBC Blood Counts - Normal, no anemia, other abnormality  Follow-up as planned in 5 months, October 2018.  Nobie Putnam, DO [facility] 05/17/2016, 10:26 AM

## 2016-05-30 ENCOUNTER — Encounter: Payer: Self-pay | Admitting: Family Medicine

## 2016-06-10 ENCOUNTER — Telehealth: Payer: Self-pay

## 2016-06-10 ENCOUNTER — Other Ambulatory Visit: Payer: Self-pay

## 2016-06-10 DIAGNOSIS — Z1211 Encounter for screening for malignant neoplasm of colon: Secondary | ICD-10-CM

## 2016-06-10 NOTE — Telephone Encounter (Signed)
Gastroenterology Pre-Procedure Review  Request Date: 6/11 Requesting Physician: Dr. Allen Norris  PATIENT REVIEW QUESTIONS: The patient responded to the following health history questions as indicated:    1. Are you having any GI issues? no 2. Do you have a personal history of Polyps? no 3. Do you have a family history of Colon Cancer or Polyps? yes (mother: polyps) 4. Diabetes Mellitus? no 5. Joint replacements in the past 12 months?no 6. Major health problems in the past 3 months?no 7. Any artificial heart valves, MVP, or defibrillator?no    MEDICATIONS & ALLERGIES:    Patient reports the following regarding taking any anticoagulation/antiplatelet therapy:   Plavix, Coumadin, Eliquis, Xarelto, Lovenox, Pradaxa, Brilinta, or Effient? no Aspirin? yes (81mg )  Patient confirms/reports the following medications:  Current Outpatient Prescriptions  Medication Sig Dispense Refill  . albuterol (PROVENTIL HFA;VENTOLIN HFA) 108 (90 Base) MCG/ACT inhaler Inhale into the lungs every 6 (six) hours as needed for wheezing or shortness of breath.    Marland Kitchen aspirin EC 81 MG tablet Take 81 mg by mouth daily.    . cetirizine (ZYRTEC) 10 MG tablet Take 10 mg by mouth daily.    . hydrochlorothiazide (HYDRODIURIL) 25 MG tablet Take 1 tablet (25 mg total) by mouth daily. 90 tablet 3  . lisinopril (PRINIVIL,ZESTRIL) 20 MG tablet Take 1 tablet (20 mg total) by mouth daily. 90 tablet 3   No current facility-administered medications for this visit.     Patient confirms/reports the following allergies:  No Known Allergies  No orders of the defined types were placed in this encounter.   AUTHORIZATION INFORMATION Primary Insurance: 1D#: Group #:  Secondary Insurance: 1D#: Group #:  SCHEDULE INFORMATION: Date: 6/11 Time: Location: [place]

## 2016-06-24 ENCOUNTER — Other Ambulatory Visit: Payer: Self-pay

## 2016-06-24 ENCOUNTER — Telehealth: Payer: Self-pay | Admitting: Gastroenterology

## 2016-06-24 MED ORDER — PEG 3350-KCL-NABCB-NACL-NASULF 236 G PO SOLR: ORAL | 0 refills | Status: DC

## 2016-06-24 NOTE — Telephone Encounter (Signed)
Not a pulmonary pt.

## 2016-06-24 NOTE — Telephone Encounter (Signed)
[facility] in [place] needs a cheaper prep called in for Regina Barber. Fax [phone]

## 2016-06-24 NOTE — Telephone Encounter (Signed)
Rx for a different bowel prep has been sent to pt's pharmacy. Left vm letting her know this was done.

## 2016-06-24 NOTE — Discharge Instructions (Signed)
General Anesthesia, Adult, Care After °These instructions provide you with information about caring for yourself after your procedure. Your health care provider may also give you more specific instructions. Your treatment has been planned according to current medical practices, but problems sometimes occur. Call your health care provider if you have any problems or questions after your procedure. °What can I expect after the procedure? °After the procedure, it is common to have: °· Vomiting. °· A sore throat. °· Mental slowness. ° °It is common to feel: °· Nauseous. °· Cold or shivery. °· Sleepy. °· Tired. °· Sore or achy, even in parts of your body where you did not have surgery. ° °Follow these instructions at home: °For at least 24 hours after the procedure: °· Do not: °? Participate in activities where you could fall or become injured. °? Drive. °? Use heavy machinery. °? Drink alcohol. °? Take sleeping pills or medicines that cause drowsiness. °? Make important decisions or sign legal documents. °? Take care of children on your own. °· Rest. °Eating and drinking °· If you vomit, drink water, juice, or soup when you can drink without vomiting. °· Drink enough fluid to keep your urine clear or pale yellow. °· Make sure you have little or no nausea before eating solid foods. °· Follow the diet recommended by your health care provider. °General instructions °· Have a responsible adult stay with you until you are awake and alert. °· Return to your normal activities as told by your health care provider. Ask your health care provider what activities are safe for you. °· Take over-the-counter and prescription medicines only as told by your health care provider. °· If you smoke, do not smoke without supervision. °· Keep all follow-up visits as told by your health care provider. This is important. °Contact a health care provider if: °· You continue to have nausea or vomiting at home, and medicines are not helpful. °· You  cannot drink fluids or start eating again. °· You cannot urinate after 8-12 hours. °· You develop a skin rash. °· You have fever. °· You have increasing redness at the site of your procedure. °Get help right away if: °· You have difficulty breathing. °· You have chest pain. °· You have unexpected bleeding. °· You feel that you are having a life-threatening or urgent problem. °This information is not intended to replace advice given to you by your health care provider. Make sure you discuss any questions you have with your health care provider. °Document Released: 04/11/2000 Document Revised: 06/08/2015 Document Reviewed: 12/18/2014 °Elsevier Interactive Patient Education © 2018 [employer] ° °

## 2016-06-27 ENCOUNTER — Ambulatory Visit: Payer: BC Managed Care – PPO | Admitting: Anesthesiology

## 2016-06-27 ENCOUNTER — Encounter
Admission: RE | Disposition: A | Discharge status: Home / Self Care | Payer: Self-pay | Source: Ambulatory Visit | Attending: Gastroenterology

## 2016-06-27 ENCOUNTER — Ambulatory Visit
Admission: RE | Admit: 2016-06-27 | Discharge: 2016-06-27 | Disposition: A | Discharge status: Home / Self Care | Payer: BC Managed Care – PPO | Source: Ambulatory Visit | Attending: Gastroenterology | Admitting: Gastroenterology

## 2016-06-27 DIAGNOSIS — Z1211 Encounter for screening for malignant neoplasm of colon: Secondary | ICD-10-CM

## 2016-06-27 DIAGNOSIS — Z7982 Long term (current) use of aspirin: Secondary | ICD-10-CM | POA: Insufficient documentation

## 2016-06-27 DIAGNOSIS — Z87891 Personal history of nicotine dependence: Secondary | ICD-10-CM | POA: Insufficient documentation

## 2016-06-27 DIAGNOSIS — Z79899 Other long term (current) drug therapy: Secondary | ICD-10-CM | POA: Insufficient documentation

## 2016-06-27 DIAGNOSIS — I10 Essential (primary) hypertension: Secondary | ICD-10-CM | POA: Diagnosis not present

## 2016-06-27 DIAGNOSIS — K635 Polyp of colon: Secondary | ICD-10-CM | POA: Diagnosis not present

## 2016-06-27 DIAGNOSIS — D125 Benign neoplasm of sigmoid colon: Secondary | ICD-10-CM

## 2016-06-27 DIAGNOSIS — J45909 Unspecified asthma, uncomplicated: Secondary | ICD-10-CM | POA: Insufficient documentation

## 2016-06-27 HISTORY — PX: POLYPECTOMY: SHX5525

## 2016-06-27 HISTORY — PX: COLONOSCOPY WITH PROPOFOL: SHX5780

## 2016-06-27 SURGERY — COLONOSCOPY WITH PROPOFOL
Anesthesia: Monitor Anesthesia Care | Wound class: Contaminated

## 2016-06-27 MED ORDER — PROPOFOL 10 MG/ML IV BOLUS
INTRAVENOUS | Status: DC | PRN
  Administered 2016-06-27 (×3): 20 mg via INTRAVENOUS
  Administered 2016-06-27: 70 mg via INTRAVENOUS
  Administered 2016-06-27: 20 mg via INTRAVENOUS
  Administered 2016-06-27: 10 mg via INTRAVENOUS
  Administered 2016-06-27 (×4): 20 mg via INTRAVENOUS

## 2016-06-27 MED ORDER — LIDOCAINE HCL (CARDIAC) 20 MG/ML IV SOLN
INTRAVENOUS | Status: DC | PRN
  Administered 2016-06-27: 40 mg via INTRAVENOUS

## 2016-06-27 MED ORDER — LACTATED RINGERS IV SOLN
INTRAVENOUS | Status: DC
  Administered 2016-06-27: 08:00:00 via INTRAVENOUS

## 2016-06-27 MED ORDER — STERILE WATER FOR IRRIGATION IR SOLN
Status: DC | PRN
  Administered 2016-06-27: 09:00:00

## 2016-06-27 SURGICAL SUPPLY — 23 items
CANISTER SUCT 1200ML W/VALVE (MISCELLANEOUS) ×4 IMPLANT
CLIP HMST 235XBRD CATH ROT (MISCELLANEOUS) IMPLANT
CLIP RESOLUTION 360 11X235 (MISCELLANEOUS)
FCP ESCP3.2XJMB 240X2.8X (MISCELLANEOUS)
FORCEPS BIOP RAD 4 LRG CAP 4 (CUTTING FORCEPS) ×4 IMPLANT
FORCEPS BIOP RJ4 240 W/NDL (MISCELLANEOUS)
FORCEPS ESCP3.2XJMB 240X2.8X (MISCELLANEOUS) IMPLANT
GOWN CVR UNV OPN BCK APRN NK (MISCELLANEOUS) ×4 IMPLANT
GOWN ISOL THUMB LOOP REG UNIV (MISCELLANEOUS) ×4
INJECTOR VARIJECT VIN23 (MISCELLANEOUS) IMPLANT
KIT DEFENDO VALVE AND CONN (KITS) IMPLANT
KIT ENDO PROCEDURE OLY (KITS) ×4 IMPLANT
MARKER SPOT ENDO TATTOO 5ML (MISCELLANEOUS) IMPLANT
PAD GROUND ADULT SPLIT (MISCELLANEOUS) IMPLANT
PROBE APC STR FIRE (PROBE) IMPLANT
RETRIEVER NET ROTH 2.5X230 LF (MISCELLANEOUS) IMPLANT
SNARE SHORT THROW 13M SML OVAL (MISCELLANEOUS) ×4 IMPLANT
SNARE SHORT THROW 30M LRG OVAL (MISCELLANEOUS) IMPLANT
SNARE SNG USE RND 15MM (INSTRUMENTS) IMPLANT
SPOT EX ENDOSCOPIC TATTOO (MISCELLANEOUS)
TRAP ETRAP POLY (MISCELLANEOUS) ×4 IMPLANT
VARIJECT INJECTOR VIN23 (MISCELLANEOUS)
WATER STERILE IRR 250ML POUR (IV SOLUTION) ×4 IMPLANT

## 2016-06-27 NOTE — Anesthesia Procedure Notes (Addendum)
Performed by: Kirstyn Lean Pre-anesthesia Checklist: Patient identified, Emergency Drugs available, Suction available, Timeout performed and Patient being monitored Patient Re-evaluated:Patient Re-evaluated prior to induction Oxygen Delivery Method: Nasal cannula Placement Confirmation: positive ETCO2       

## 2016-06-27 NOTE — Anesthesia Postprocedure Evaluation (Addendum)
Anesthesia Post Note  Patient: Regina Barber  Procedure(s) Performed: Procedure(s) (LRB): COLONOSCOPY WITH PROPOFOL (N/A) POLYPECTOMY  Patient location during evaluation: PACU Anesthesia Type: General Level of consciousness: awake and alert Pain management: pain level controlled Vital Signs Assessment: post-procedure vital signs reviewed and stable Respiratory status: spontaneous breathing, nonlabored ventilation, respiratory function stable and patient connected to nasal cannula oxygen Cardiovascular status: stable and blood pressure returned to baseline Anesthetic complications: no    Alisa Graff

## 2016-06-27 NOTE — Transfer of Care (Addendum)
Immediate Anesthesia Transfer of Care Note  Patient: Regina Barber  Procedure(s) Performed: Procedure(s): COLONOSCOPY WITH PROPOFOL (N/A) POLYPECTOMY  Patient Location: PACU  Anesthesia Type: General  Level of Consciousness: awake, alert  and patient cooperative  Airway and Oxygen Therapy: Patient Spontanous Breathing and Patient connected to supplemental oxygen  Post-op Assessment: Post-op Vital signs reviewed, Patient's Cardiovascular Status Stable, Respiratory Function Stable, Patent Airway and No signs of Nausea or vomiting  Post-op Vital Signs: Reviewed and stable  Complications: No apparent anesthesia complications

## 2016-06-27 NOTE — Op Note (Signed)
[facility] Patient Name: Regina Barber Procedure Date: 06/27/2016 8:31 AM MRN: [id number] Account #: [id number] Date of Birth: 03/07/1960 Admit Type: Outpatient Age: 56 Room: [facility] OR ROOM 01 Gender: Female Note Status: Finalized Procedure:            Colonoscopy Indications:          Screening for colorectal malignant neoplasm Providers:            Lucilla Lame MD, MD Referring MD:         Olin Hauser (Referring MD) Medicines:            Propofol per Anesthesia Complications:        No immediate complications. Procedure:            Pre-Anesthesia Assessment:                       - Prior to the procedure, a History and Physical was                        performed, and patient medications and allergies were                        reviewed. The patient's tolerance of previous                        anesthesia was also reviewed. The risks and benefits of                        the procedure and the sedation options and risks were                        discussed with the patient. All questions were                        answered, and informed consent was obtained. Prior                        Anticoagulants: The patient has taken no previous                        anticoagulant or antiplatelet agents. ASA Grade                        Assessment: II - A patient with mild systemic disease.                        After reviewing the risks and benefits, the patient was                        deemed in satisfactory condition to undergo the                        procedure.                       After obtaining informed consent, the colonoscope was                        passed under direct vision. Throughout the procedure,  the patient's blood pressure, pulse, and oxygen                        saturations were monitored continuously. The [place] [id number]) was introduced  through the                        anus and advanced to the the cecum, identified by                        appendiceal orifice and ileocecal valve. The                        colonoscopy was performed without difficulty. The                        patient tolerated the procedure well. The quality of                        the bowel preparation was excellent. Findings:      The perianal and digital rectal examinations were normal.      Three sessile polyps were found in the sigmoid colon. The polyps were 4       to 5 mm in size. These polyps were removed with a cold snare. Resection       and retrieval were complete.      A 3 mm polyp was found in the sigmoid colon. The polyp was sessile. The       polyp was removed with a cold biopsy forceps. Resection and retrieval       were complete. Impression:           - Three 4 to 5 mm polyps in the sigmoid colon, removed                        with a cold snare. Resected and retrieved.                       - One 3 mm polyp in the sigmoid colon, removed with a                        cold biopsy forceps. Resected and retrieved. Recommendation:       - Discharge patient to home.                       - Resume previous diet.                       - Continue present medications.                       - Await pathology results.                       - Repeat colonoscopy in 5 years if polyp adenoma and 10                        years if hyperplastic Procedure Code(s):    --- Professional ---  45385, Colonoscopy, flexible; with removal of tumor(s),                        polyp(s), or other lesion(s) by snare technique                       45380, 59, Colonoscopy, flexible; with biopsy, single                        or multiple Diagnosis Code(s):    --- Professional ---                       Z12.11, Encounter for screening for malignant neoplasm                        of colon                       D12.5, Benign neoplasm of  sigmoid colon CPT copyright 2016 American Medical Association. All rights reserved. The codes documented in this report are preliminary and upon coder review may  be revised to meet current compliance requirements. Lucilla Lame MD, MD 06/27/2016 8:57:54 AM This report has been signed electronically. Number of Addenda: 0 Note Initiated On: 06/27/2016 8:31 AM Scope Withdrawal Time: 0 hours 6 minutes 11 seconds  Total Procedure Duration: 0 hours 14 minutes 9 seconds       [facility]

## 2016-06-27 NOTE — Anesthesia Preprocedure Evaluation (Addendum)
Anesthesia Evaluation  Patient identified by MRN, date of birth, ID band Patient awake    Reviewed: Allergy & Precautions, H&P , NPO status , Patient's Chart, lab work & pertinent test results, reviewed documented beta blocker date and time   Airway Mallampati: II  TM Distance: >3 FB Neck ROM: full    Dental no notable dental hx.    Pulmonary asthma , former smoker,    Pulmonary exam normal breath sounds clear to auscultation       Cardiovascular Exercise Tolerance: Good hypertension,  Rhythm:regular Rate:Normal     Neuro/Psych negative neurological ROS  negative psych ROS   GI/Hepatic negative GI ROS, Neg liver ROS,   Endo/Other  negative endocrine ROS  Renal/GU negative Renal ROS  negative genitourinary   Musculoskeletal   Abdominal   Peds  Hematology negative hematology ROS (+)   Anesthesia Other Findings   Reproductive/Obstetrics negative OB ROS                             Anesthesia Physical Anesthesia Plan  ASA: II  Anesthesia Plan: General   Post-op Pain Management:    Induction:   PONV Risk Score and Plan:   Airway Management Planned:   Additional Equipment:   Intra-op Plan:   Post-operative Plan:   Informed Consent: I have reviewed the patients History and Physical, chart, labs and discussed the procedure including the risks, benefits and alternatives for the proposed anesthesia with the patient or authorized representative who has indicated his/her understanding and acceptance.   Dental Advisory Given  Plan Discussed with: CRNA  Anesthesia Plan Comments:        Anesthesia Quick Evaluation

## 2016-06-27 NOTE — H&P (Signed)
   Lucilla Lame, MD [facility] [address], [place], [place] [address] Phone: [phone] Fax : [phone]  Primary Care Physician:  Olin Hauser, DO Primary Gastroenterologist:  Dr. Allen Norris  Pre-Procedure History & Physical: HPI:  Regina Barber is a 56 y.o. female is here for a screening colonoscopy.   Past Medical History:  Diagnosis Date  . Allergy   . Asthma   . Genital warts   . Hypertension     Past Surgical History:  Procedure Laterality Date  . ABDOMINAL HYSTERECTOMY  1987   Bilateral ovaries spared, reportedly cervix removed.    Prior to Admission medications   Medication Sig Start Date End Date Taking? Authorizing Provider  albuterol (PROVENTIL HFA;VENTOLIN HFA) 108 (90 Base) MCG/ACT inhaler Inhale into the lungs every 6 (six) hours as needed for wheezing or shortness of breath.   Yes [provider]  aspirin EC 81 MG tablet Take 81 mg by mouth daily.   Yes [provider]  cetirizine (ZYRTEC) 10 MG tablet Take 10 mg by mouth daily.   Yes [provider]  hydrochlorothiazide (HYDRODIURIL) 25 MG tablet Take 1 tablet (25 mg total) by mouth daily. 11/09/15  Yes Karamalegos, Devonne Doughty, DO  lisinopril (PRINIVIL,ZESTRIL) 20 MG tablet Take 1 tablet (20 mg total) by mouth daily. 11/09/15  Yes Karamalegos, Devonne Doughty, DO  polyethylene glycol (GOLYTELY) 236 g solution Drink one 8 oz glass every 20 mins until stools are clear starting at 5:00pm on 06/26/16 06/24/16  Yes Lucilla Lame, MD    Allergies as of 06/10/2016  . (No Known Allergies)    Family History  Problem Relation Age of Onset  . Cirrhosis Father 25    Social History   Social History  . Marital status: Married    Spouse name: N/A  . Number of children: 2  . Years of education: Associate's Degree   Occupational History  . School Custodian    Social History Main Topics  . Smoking status: Former Smoker    Packs/day: 1.00    Years: 30.00    Types: Cigarettes     Quit date: 2008  . Smokeless tobacco: Former Systems developer     Comment: Quit on own after birth of daughter  . Alcohol use No  . Drug use: No     Comment: Former history of substance use  . Sexual activity: Not on file   Other Topics Concern  . Not on file   Social History Narrative  . No narrative on file    Review of Systems: See HPI, otherwise negative ROS  Physical Exam: BP 116/81   Pulse 81   Temp 97.2 F (36.2 C) (Temporal)   Resp 16   Ht 5\' 4"  (1.626 m)   Wt 131 lb (59.4 kg)   SpO2 100%   BMI 22.49 kg/m  General:   Alert,  pleasant and cooperative in NAD Head:  Normocephalic and atraumatic. Neck:  Supple; no masses or thyromegaly. Lungs:  Clear throughout to auscultation.    Heart:  Regular rate and rhythm. Abdomen:  Soft, nontender and nondistended. Normal bowel sounds, without guarding, and without rebound.   Neurologic:  Alert and  oriented x4;  grossly normal neurologically.  Impression/Plan: Regina Barber is now here to undergo a screening colonoscopy.  Risks, benefits, and alternatives regarding colonoscopy have been reviewed with the patient.  Questions have been answered.  All parties agreeable.

## 2016-06-28 ENCOUNTER — Encounter: Payer: Self-pay | Admitting: Gastroenterology

## 2016-06-30 NOTE — Addendum Note (Signed)
Addendum  created 06/30/16 1129 by Mayme Genta, CRNA   Anesthesia Intra Blocks edited, Sign clinical note

## 2016-07-01 ENCOUNTER — Encounter: Payer: Self-pay | Admitting: Gastroenterology

## 2016-07-05 NOTE — Addendum Note (Signed)
Addendum  created 07/05/16 0742 by Alisa Graff, MD   Sign clinical note

## 2016-09-12 ENCOUNTER — Encounter: Payer: Self-pay | Admitting: Family Medicine

## 2016-09-12 ENCOUNTER — Ambulatory Visit (INDEPENDENT_AMBULATORY_CARE_PROVIDER_SITE_OTHER): Payer: BC Managed Care – PPO | Admitting: Family Medicine

## 2016-09-12 VITALS — BP 109/63 | HR 90 | Temp 98.7°F | Resp 16 | Ht 64.0 in | Wt 128.0 lb

## 2016-09-12 DIAGNOSIS — M150 Primary generalized (osteo)arthritis: Secondary | ICD-10-CM

## 2016-09-12 DIAGNOSIS — M159 Polyosteoarthritis, unspecified: Secondary | ICD-10-CM

## 2016-09-12 DIAGNOSIS — M26621 Arthralgia of right temporomandibular joint: Secondary | ICD-10-CM | POA: Diagnosis not present

## 2016-09-12 MED ORDER — IBUPROFEN 600 MG PO TABS: 600.0000 mg | ORAL_TABLET | Freq: Three times a day (TID) | ORAL | 1 refills | Status: DC | PRN

## 2016-09-12 NOTE — Patient Instructions (Addendum)
Thank you for coming to the clinic today.  1. 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 In the future you can repeat the same everyday Tylenol course for 1-2 weeks at a time.  - This is safe to take with anti-inflammatory medicines (Ibuprofen, Advil, Naproxen, Aleve, Meloxicam, Mobic)  2. Follow-up with your Dentist, likely need a "Panorex" or dental x-ray to check TMJ joint, and may recommend a mouthguard or something other dental appliance to limit grinding.  Avoid hard crunch food and excess chewing for now.  Please schedule a Follow-up Appointment to: Return if symptoms worsen or fail to improve, for Already scheduled October 2018.  If you have any other questions or concerns, please feel free to call the clinic or send a message through [place]. You may also schedule an earlier appointment if necessary.  Additionally, you may be receiving a survey about your experience at our clinic within a few days to 1 week by e-mail or mail. We value your feedback.  Nobie Putnam, DO [facility], [facility]  Temporomandibular Joint Syndrome Temporomandibular joint (TMJ) syndrome is a condition that affects the joints between your jaw and your skull. The TMJs are located near your ears and allow your jaw to open and close. These joints and the nearby muscles are involved in all movements of the jaw. People with TMJ syndrome have pain in the area of these joints and muscles. Chewing, biting, or other movements of the jaw can be  difficult or painful. TMJ syndrome can be caused by various things. In many cases, the condition is mild and goes away within a few weeks. For some people, the condition can become a long-term problem. What are the causes? Possible causes of TMJ syndrome include:  Grinding your teeth or clenching your jaw. Some people do this when they are under stress.  Arthritis.  Injury to the jaw.  Head or neck injury.  Teeth or dentures that are not aligned well.  In some cases, the cause of TMJ syndrome may not be known. What are the signs or symptoms? The most common symptom is an aching pain on the side of the head in the area of the TMJ. Other symptoms may include:  Pain when moving your jaw, such as when chewing or biting.  Being unable to open your jaw all the way.  Making a clicking sound when you open your mouth.  Headache.  Earache.  Neck or shoulder pain.  How is this diagnosed? Diagnosis can usually be made based on your symptoms, your medical history, and a physical exam. Your health care provider may check the range of motion of your jaw. Imaging tests, such as X-rays or an MRI, are sometimes done. You may need to see your dentist to determine if your teeth and jaw are lined up correctly. How is this treated? TMJ syndrome often goes away on its own. If treatment is needed, the options may include:  Eating soft foods and applying  ice or heat.  Medicines to relieve pain or inflammation.  Medicines to relax the muscles.  A splint, bite plate, or mouthpiece to prevent teeth grinding or jaw clenching.  Relaxation techniques or counseling to help reduce stress.  Transcutaneous electrical nerve stimulation (TENS). This helps to relieve pain by applying an electrical current through the skin.  Acupuncture. This is sometimes helpful to relieve pain.  Jaw surgery. This is rarely needed.  Follow these instructions at home:  Take medicines only as directed by your health  care provider.  Eat a soft diet if you are having trouble chewing.  Apply ice to the painful area. ? Put ice in a plastic bag. ? Place a towel between your skin and the bag. ? Leave the ice on for 20 minutes, 2-3 times a day.  Apply a warm compress to the painful area as directed.  Massage your jaw area and perform any jaw stretching exercises as recommended by your health care provider.  If you were given a mouthpiece or bite plate, wear it as directed.  Avoid foods that require a lot of chewing. Do not chew gum.  Keep all follow-up visits as directed by your health care provider. This is important. Contact a health care provider if:  You are having trouble eating.  You have new or worsening symptoms. Get help right away if:  Your jaw locks open or closed. This information is not intended to replace advice given to you by your health care provider. Make sure you discuss any questions you have with your health care provider. Document Released: 09/28/2000 Document Revised: 09/03/2015 Document Reviewed: 08/08/2013 Elsevier Interactive Patient Education  2018 [place].   Jaw Range of Motion Exercises Jaw range of motion exercises are exercises that help your jaw to move better. These exercises can help to prevent:  Difficulty opening your mouth.  Pain in your jaw while it is both open and closed.  What should I be careful of when doing jaw exercises? Make sure that you only do jaw exercises as directed by your health care provider. You should only move your jaw as far as it can go in each direction, if told to do so by your health care provider. Do not move your jaw into positions that cause you any pain. What exercises should I do?  Stick your jaw forward. Hold this position for 1-2 seconds. Allow your jaw to return to its normal position and rest it there for 1-2 seconds. Do this exercise 8 times.  Stand or sit in front of a mirror. Place your tongue on the roof of  your mouth, just behind your top teeth. Slowly open and close your jaw, keeping your tongue on the roof of your mouth. While you open and close your mouth, try to keep your jaw from moving toward one side or the other. Repeat this 8 times.  Move your jaw right. Hold this position for 1-2 seconds. Allow your jaw to return to its normal position, and rest it there for 1-2 seconds. Do this exercise 8 times.  Move your jaw left. Hold this position for 1-2 seconds. Allow your jaw to return to its normal position, and rest it there for 1-2 seconds. Do this exercise 8 times.  Open your mouth as far as it is can comfortably go. Hold this position for 1-2 seconds. Then close your mouth and rest for 1-2 seconds. Do this exercise 8 times.  Move your jaw in a circular motion, starting toward  the right (clockwise). Repeat this 8 times.  Move your jaw in a circular motion, starting toward the left (counterclockwise). Repeat this 8 times. Apply moist heat packs or ice packs to your jaw before or after performing your exercises as directed by your health care provider. What else can I do? Avoid the following, if they cause jaw pain or they increase your jaw pain:  Chewing gum.  Clenching your jaw or teeth or keeping tension in your jaw muscles.  Leaning on your jaw, such as resting your jaw in your hand while leaning on a desk.  This information is not intended to replace advice given to you by your health care provider. Make sure you discuss any questions you have with your health care provider. Document Released: 12/17/2007 Document Revised: 06/11/2015 Document Reviewed: 12/04/2013 Elsevier Interactive Patient Education  [employer].

## 2016-09-12 NOTE — Progress Notes (Signed)
Subjective:    Patient ID: Regina Barber, female    DOB: [birth year], 56 y.o.   MRN: [id number]  Regina Barber is a 56 y.o. female presenting on 09/12/2016 for Ear Pain (Right side sharp pain in Ear achy onset week progressively getting worst with jaw pain and eye pain Tylenol 500 mg taken not improving her Sx)   HPI   RIGHT JAW (TMJ) / EAR PAIN: Reports symptoms started about 3 days ago with worsening progressive R sided ear and jaw / facial pain, without acute injury or trigger, she will feel intermittent episodic aching pain R sided, and some occasional sharp pains, episodes lasting hours to almost whole day this weekend, some stiffness in R side of jaw and worse with eating and chewing had to switch to chewing on L, she tried Extra Strength Tylenol 500mg  with some mild to moderate relief. - Also she does a lot of singing but this does not seem to bother her jaw pain - History of R ear mild effusion in past 04/2016 - History of grinding teeth, she plans to return to her dentist - History of OA/DJD in multiple other joints - Denies any trauma, injury, loss of hearing, headache, sinus symptoms, vision changes, chest pain, dyspnea   Social History  Substance Use Topics  . Smoking status: Former Smoker    Packs/day: 1.00    Years: 30.00    Types: Cigarettes    Quit date: 2008  . Smokeless tobacco: Former Systems developer     Comment: Quit on own after birth of daughter  . Alcohol use No    Review of Systems Per HPI unless specifically indicated above     Objective:    BP 109/63   Pulse 90   Temp 98.7 F (37.1 C) (Oral)   Resp 16   Ht 5\' 4"  (1.626 m)   Wt 128 lb (58.1 kg)   BMI 21.97 kg/m   Wt Readings from Last 3 Encounters:  09/12/16 128 lb (58.1 kg)  06/27/16 131 lb (59.4 kg)  05/09/16 136 lb 9.6 oz (62 kg)    Physical Exam  Constitutional: She is oriented to person, place, and time. She appears well-developed and well-nourished. No distress.  Well-appearing, comfortable,  cooperative  HENT:  Head: Normocephalic and atraumatic.  Mouth/Throat: Oropharynx is clear and moist.  Frontal / maxillary sinuses non-tender. Nares patent without purulence or edema. Bilateral TMs clear without erythema, effusion or bulging. R external ear and tragus non tender. 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 Right eye exhibits no discharge. Left eye exhibits no discharge.  Neck: Normal range of motion. Neck supple. No thyromegaly present.  Cardiovascular: Normal rate.   Pulmonary/Chest: Effort normal.  Musculoskeletal: She exhibits no edema.  Lymphadenopathy:    She has no cervical adenopathy.  Neurological: She is alert and oriented to person, place, and time.  Skin: Skin is warm and dry. No rash noted. She is not diaphoretic. No erythema.  Psychiatric: She has a normal mood and affect. Her behavior is normal.  Well groomed, good eye contact, normal speech and thoughts  Nursing note and vitals reviewed.    Results for orders placed or performed in visit on 05/30/16  DG Knee 1-2 Views Left  Result Value Ref Range   Other      Negative for acute injury or degenerative changes (L knee X-ray)      Assessment & Plan:   Problem List Items Addressed This Visit    Osteoarthritis of multiple joints    Likely contributing factor with known OA/DJD in other joints with current R TMJ arthralgia symptoms See A&P      Relevant Medications   ibuprofen (ADVIL,MOTRIN) 600 MG tablet    Other Visit Diagnoses    Arthralgia of right temporomandibular joint    -  Primary  Suspected R TMJ arthralgia with likely OA/DJD, without acute injury, possibly overuse with chewing,  bruxism grinding and possibly affected by singing but seems less likely. R ear is normal otherwise no evidence of other head/neck pathology on exam and history. - Reassurance - Recommend follow-up with Dentist for dental / panarex x-rays for TMJ, would likely benefit from mouthguard to avoid grinding - start rx Ibuprofen 600mg  TID wc for 1-2 weeks then PRN, also take Tylenol 500-1000mg  TID PRN breakthrough can use tylenol regularly in future for OA/DJD - Lifestyle precautions on avoiding hard crunchy foods, excess chewing, avoid jaw strain - may continue to sing for now if not flaring pain - Follow-up if not improved    Relevant Medications   ibuprofen (ADVIL,MOTRIN) 600 MG tablet      Meds ordered this encounter  Medications  . ibuprofen (ADVIL,MOTRIN) 600 MG tablet    Sig: Take 1 tablet (600 mg total) by mouth every 8 (eight) hours as needed. With food    Dispense:  60 tablet    Refill:  1    Follow up plan: Return if symptoms worsen or fail to improve, for Already scheduled October 2018.  Nobie Putnam, DO [facility] 09/12/2016, 1:23 PM

## 2016-09-12 NOTE — Assessment & Plan Note (Signed)
Likely contributing factor with known OA/DJD in other joints with current R TMJ arthralgia symptoms See A&P

## 2016-09-23 ENCOUNTER — Telehealth: Payer: Self-pay

## 2016-09-23 DIAGNOSIS — J4520 Mild intermittent asthma, uncomplicated: Secondary | ICD-10-CM

## 2016-09-23 MED ORDER — PREDNISONE 50 MG PO TABS: 50.0000 mg | ORAL_TABLET | Freq: Every day | ORAL | 0 refills | Status: DC

## 2016-09-23 MED ORDER — ALBUTEROL SULFATE HFA 108 (90 BASE) MCG/ACT IN AERS: 1.0000 | INHALATION_SPRAY | RESPIRATORY_TRACT | 5 refills | Status: DC | PRN

## 2016-09-23 NOTE — Telephone Encounter (Signed)
Rx Prednisone burst and Albuterol ventolin sent to [facility] in [place].  Agree with plan as discussed below, will empirically treat for asthma exac now, no office availability today, she may go to Urgent Care / ED if not improved. Follow-up next week.  Nobie Putnam, DO [facility] 09/23/2016, 1:08 PM

## 2016-09-23 NOTE — Telephone Encounter (Signed)
Patient made an appointment to follow up on Asthma on Monday at 8:00.  She will need a refill on ventolin and treatment that we discussed.  Patient was very appreciative that you were will to help her and she understands that she needs to go to urgent care over weekend if she is getting worse.  [facility]

## 2016-09-26 ENCOUNTER — Ambulatory Visit (INDEPENDENT_AMBULATORY_CARE_PROVIDER_SITE_OTHER): Payer: BC Managed Care – PPO | Admitting: Family Medicine

## 2016-09-26 ENCOUNTER — Encounter: Payer: Self-pay | Admitting: Family Medicine

## 2016-09-26 VITALS — BP 116/59 | HR 91 | Temp 98.8°F | Resp 16 | Ht 64.0 in | Wt 132.0 lb

## 2016-09-26 DIAGNOSIS — J3089 Other allergic rhinitis: Secondary | ICD-10-CM | POA: Diagnosis not present

## 2016-09-26 DIAGNOSIS — J4520 Mild intermittent asthma, uncomplicated: Secondary | ICD-10-CM | POA: Diagnosis not present

## 2016-09-26 MED ORDER — MONTELUKAST SODIUM 10 MG PO TABS: 10.0000 mg | ORAL_TABLET | Freq: Every day | ORAL | 5 refills | Status: DC

## 2016-09-26 NOTE — Assessment & Plan Note (Signed)
Likely underlying factor for her recent asthma exac Off Zyrtec, should resume however agree to try back on Singulair instead 10mg  daily new rx sent, if need add Zyrtec OTC in future she should do so

## 2016-09-26 NOTE — Patient Instructions (Addendum)
Thank you for coming to the clinic today.  1. Finish your Prednisone 2. Continue Albuterol inhaler use as needed 3. Start Singulair 10mg  once nightly for asthma and allergies. May stop Zyrtec for now, if need again in future may take BOTH 4. Flu shot next visit in October  Please schedule a Follow-up Appointment to: Return in about 6 weeks (around 11/08/2016) for follow-up as scheduled 10/23.  If you have any other questions or concerns, please feel free to call the clinic or send a message through [place]. You may also schedule an earlier appointment if necessary.  Additionally, you may be receiving a survey about your experience at our clinic within a few days to 1 week by e-mail or mail. We value your feedback.  Nobie Putnam, DO [place]

## 2016-09-26 NOTE — Assessment & Plan Note (Signed)
Consistent with resolving mild acute asthma exacerbation in setting of mild intermittent asthma. Previously well controlled. Current flare triggered by likely environmental allergies. - No wheezing on exam without focal lung crackles, no evidence of PNA, not consistent with sinusitis, no CP or SOB. - No recent exacerbation or hospitalization  Plan: 1. Finish current Prednisone burst 50mg  daily x 5 days - last dose 09/28/16 2. Continue Albuterol 2 puffs q 4-6 hour PRN wheezing/cough/SOB x 3 days regularly, then PRN 3. Start new Singulair 10mg  daily - asthma/allergy (previously on in past) 4. Return criteria given to follow-up vs when to go to ED

## 2016-09-26 NOTE — Progress Notes (Signed)
Subjective:    Patient ID: Regina Barber, female    DOB: 1960/06/16, 56 y.o.   MRN: [id number]  Regina Barber is a 56 y.o. female presenting on 09/26/2016 for Asthma (improved)   HPI   Asthma Exacerbation, Mild Intermittent - Reports symptoms started last week with some occasional difficulty breathing and mild wheezing, then worsening on Friday 9/7 with concern for actual asthma flare, no clear known trigger, called office but no availability and she was given Prednisone burst 50mg  daily x 5 days and refilled Albuterol, she used her old albuterol inhaler and felt very jittery and unsettled worse than before. - Today she reports overall dramatic improvement on the Prednisone, she started the following day, now has 2 more doses left after today, last dose 9/12. Will pick up new albuterol inhaler today, as it had to be ordered by pharmacy - She thinks allergies trigger, started to have some sneezing and congestion. She is out of Zyrtec, but can pick up new bottle. Also previously [place] Singulair [place] but [place]  she was taken off of it was told it is same as zyrtec - Denies any fevers/chills, coughs, dyspnea, chest pain or tightness, swelling, nausea vomiting  Health Maintenance: - Due for Flu Shot 2018, did not receive last year, but is considering to get this year, will consider at next apt in October 2018  Social History  Substance Use Topics  . Smoking status: Former Smoker    Packs/day: 1.00    Years: 30.00    Types: Cigarettes    Quit date: 2008  . Smokeless tobacco: Former Systems developer     Comment: Quit on own after birth of daughter  . Alcohol use No    Review of Systems Per HPI unless specifically indicated above     Objective:    BP (!) 116/59   Pulse 91   Temp 98.8 F (37.1 C) (Oral)   Resp 16   Ht 5\' 4"  (1.626 m)   Wt 132 lb (59.9 kg)   SpO2 100%   BMI 22.66 kg/m   Wt Readings from Last 3 Encounters:  09/26/16 132 lb (59.9 kg)    09/12/16 128 lb (58.1 kg)  06/27/16 131 lb (59.4 kg)    Physical Exam  Constitutional: She is oriented to person, place, and time. She appears well-developed and well-nourished. No distress.  Well-appearing, comfortable, cooperative  HENT:  Head: Normocephalic and atraumatic.  Mouth/Throat: Oropharynx is clear and moist.  Eyes: Conjunctivae are normal. Right eye exhibits no discharge. Left eye exhibits no discharge.  Neck: Normal range of motion. Neck supple.  Cardiovascular: Normal rate, regular rhythm, normal heart sounds and intact distal pulses.   No murmur heard. Pulmonary/Chest: Effort normal and breath sounds normal. No respiratory distress. She has no wheezes. She has no rales.  Musculoskeletal: Normal range of motion. She exhibits no edema.  Lymphadenopathy:    She has no cervical adenopathy.  Neurological: She is alert and oriented to person, place, and time.  Skin: Skin is warm and dry. No rash noted. She is not diaphoretic. No erythema.  Psychiatric: She has a normal mood and affect. Her behavior is normal.  Well groomed, good eye contact, normal speech and thoughts  Nursing note and vitals reviewed.      Assessment & Plan:   Problem List Items Addressed This Visit    Environmental and seasonal allergies    Likely underlying factor for her recent asthma exac Off  Zyrtec, should resume however agree to try back on Singulair instead 10mg  daily new rx sent, if need add Zyrtec OTC in future she should do so      Relevant Medications   montelukast (SINGULAIR) 10 MG tablet   Asthma - Primary    Consistent with resolving mild acute asthma exacerbation in setting of mild intermittent asthma. Previously well controlled. Current flare triggered by likely environmental allergies. - No wheezing on exam without focal lung crackles, no evidence of PNA, not consistent with sinusitis, no CP or SOB. - No recent exacerbation or hospitalization  Plan: 1. Finish current Prednisone  burst 50mg  daily x 5 days - last dose 09/28/16 2. Continue Albuterol 2 puffs q 4-6 hour PRN wheezing/cough/SOB x 3 days regularly, then PRN 3. Start new Singulair 10mg  daily - asthma/allergy (previously on in past) 4. Return criteria given to follow-up vs when to go to ED      Relevant Medications   montelukast (SINGULAIR) 10 MG tablet      Meds ordered this encounter  Medications  . montelukast (SINGULAIR) 10 MG tablet    Sig: Take 1 tablet (10 mg total) by mouth at bedtime.    Dispense:  30 tablet    Refill:  5    Follow up plan: Return in about 6 weeks (around 11/08/2016) for follow-up as scheduled 10/23.  Nobie Putnam, DO [facility] 09/26/2016, 8:40 AM

## 2016-10-29 ENCOUNTER — Other Ambulatory Visit: Payer: Self-pay | Admitting: Family Medicine

## 2016-10-29 DIAGNOSIS — I10 Essential (primary) hypertension: Secondary | ICD-10-CM

## 2016-11-08 ENCOUNTER — Encounter: Payer: Self-pay | Admitting: Family Medicine

## 2016-11-08 ENCOUNTER — Other Ambulatory Visit: Payer: Self-pay | Admitting: Family Medicine

## 2016-11-08 ENCOUNTER — Ambulatory Visit (INDEPENDENT_AMBULATORY_CARE_PROVIDER_SITE_OTHER): Payer: BC Managed Care – PPO | Admitting: Family Medicine

## 2016-11-08 VITALS — BP 114/73 | HR 80 | Temp 98.6°F | Resp 16 | Ht 64.0 in | Wt 129.0 lb

## 2016-11-08 DIAGNOSIS — I10 Essential (primary) hypertension: Secondary | ICD-10-CM

## 2016-11-08 DIAGNOSIS — R7303 Prediabetes: Secondary | ICD-10-CM

## 2016-11-08 DIAGNOSIS — Z23 Encounter for immunization: Secondary | ICD-10-CM | POA: Diagnosis not present

## 2016-11-08 DIAGNOSIS — E7800 Pure hypercholesterolemia, unspecified: Secondary | ICD-10-CM

## 2016-11-08 DIAGNOSIS — M150 Primary generalized (osteo)arthritis: Secondary | ICD-10-CM

## 2016-11-08 DIAGNOSIS — E785 Hyperlipidemia, unspecified: Secondary | ICD-10-CM | POA: Insufficient documentation

## 2016-11-08 DIAGNOSIS — R6884 Jaw pain: Secondary | ICD-10-CM

## 2016-11-08 DIAGNOSIS — M159 Polyosteoarthritis, unspecified: Secondary | ICD-10-CM

## 2016-11-08 DIAGNOSIS — Z789 Other specified health status: Secondary | ICD-10-CM

## 2016-11-08 DIAGNOSIS — Z0000 Encounter for general adult medical examination without abnormal findings: Secondary | ICD-10-CM

## 2016-11-08 NOTE — Assessment & Plan Note (Signed)
Well-controlled HTN - Home BP readings none  No known complications   Plan:  1. Continue current BP regimen HCTZ 25mg , Lisinopril 20mg  2. Encourage improved lifestyle - low sodium diet, regular exercise 3. Start monitor BP outside office, bring readings to next visit, if persistently >140/90 or new symptoms notify office sooner 4. Follow-up 6 months labs annual

## 2016-11-08 NOTE — Progress Notes (Signed)
Subjective:    Patient ID: Regina Barber, female    DOB: 06/30/1960, 56 y.o.   MRN: [id number]  Regina Barber is a 56 y.o. female presenting on 11/08/2016 for Hypertension and Arthritis   HPI   CHRONIC HTN: Reports no new concerns. Not checking BP outside office. Current Meds - HCTZ 25mg , Lisinopril 20mg    Reports good compliance, took meds today. Tolerating well, w/o complaints. Lifestyle: - Diet: healthy diet, improved watch diet, no salt added - Exercise: no regular exercise, increased activity Denies CP, dyspnea, HA, edema, dizziness / lightheadedness  FOLLOW-UP RIGHT JAW (TMJ) / EAR PAIN: - Last visit with me 09/12/16, for initial visit for same problem, treated with higher dose ibuprofen 600mg  and Tylenol also recommended to return to Dentist for follow-up dental X-rays eval TMJ, see prior notes for background information. 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 Smoking status: Former Smoker    Packs/day: 1.00      Years: 30.00    Types: Cigarettes    Quit date: 2008  . Smokeless tobacco: Former Systems developer     Comment: Quit on own after birth of daughter  . Alcohol use No    Review of Systems Per HPI unless specifically indicated above     Objective:    BP 114/73   Pulse 80   Temp 98.6 F (37 C) (Oral)   Resp 16   Ht 5\' 4"  (1.626 m)   Wt 129 lb (58.5 kg)   BMI 22.14 kg/m   Wt Readings from Last 3 Encounters:  11/08/16 129 lb (58.5 kg)  09/26/16 132 lb (59.9 kg)  09/12/16 128 lb (58.1 kg)    Physical Exam  Constitutional: She is oriented to person, place, and time. She appears well-developed and well-nourished. No distress.  Well-appearing, comfortable, cooperative  HENT:  Head: Normocephalic and atraumatic.  Mouth/Throat: Oropharynx is clear and moist.  Frontal / maxillary sinuses non-tender. Nares patent without purulence or edema. Bilateral TMs clear without erythema, effusion or bulging. R external ear and tragus non tender. 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 Right eye exhibits no discharge. Left eye exhibits no discharge.  Neck: Normal range of motion. Neck supple.  Cardiovascular: Normal rate, regular rhythm,  normal heart sounds and intact distal pulses.   No murmur heard. Pulmonary/Chest: Effort normal and breath sounds normal. No respiratory distress. She has no wheezes. She has no rales.  Musculoskeletal: She exhibits no edema.  Neurological: She is alert and oriented to person, place, and time.  Skin: Skin is warm and dry. No rash noted. She is not diaphoretic. No erythema.  Psychiatric: Her behavior is normal.  Nursing note and vitals reviewed.     Recent Labs  05/09/16 0801  HGBA1C 5.7*       Assessment & Plan:   Problem List Items Addressed This Visit    Hypertension - Primary    Well-controlled HTN - Home BP readings none  No known complications   Plan:  1. Continue current BP regimen HCTZ 25mg , Lisinopril 20mg  2. Encourage improved lifestyle - low sodium diet, regular exercise 3. Start monitor BP outside office, bring readings to next visit, if persistently >140/90 or new symptoms notify office sooner 4. Follow-up 6 months labs annual      Osteoarthritis of multiple joints    Less likely cause now with dental x-rays      Pre-diabetes    A1c stable previously, last 5.7 in 04/2016 Encourage continue current lifestyle diet improve exercise Next check in 6 months with annual       Other Visit Diagnoses    Jaw pain, non-TMJ       Likely secondary to wisdom teeth and other dental etiology, follow-up dentist as planned   Needs flu shot       Relevant Orders   Flu Vaccine QUAD 36+ mos IM (Completed)      No orders of the defined types were placed in this encounter.  Follow up plan: Return in about 6 months (around 05/09/2017) for Annual Physical.  Future labs ordered for 04/2017.  Nobie Putnam, [facility] 11/08/2016, 11:38 PM

## 2016-11-08 NOTE — Patient Instructions (Addendum)
Thank you for coming to the clinic today.  1. Keep up the good work  2. I am reassured that your jaw pain is improved after anti-inflammatory treatment on ibuprofen, hopefully can continue to wean down on this and reduce dose, after your dental procedures  3. BP is good, keep taking medicines, check it every once in a while to keep track of it  We will test the Hepatitis B vaccine status in the blood draw - if not immune, we can review but may not need any more vaccines  DUE for FASTING BLOOD WORK (no food or drink after midnight before the lab appointment, only water or coffee without cream/sugar on the morning of)  SCHEDULE "Lab Only" visit in the morning at the clinic for lab draw in 6 MONTHS   - Make sure Lab Only appointment is at about 1 week before your next appointment, so that results will be available  For Lab Results, once available within 2-3 days of blood draw, you can can log in to MyChart online to view your results and a brief explanation. Also, we can discuss results at next follow-up visit.  Please schedule a Follow-up Appointment to: Return in about 6 months (around 05/09/2017) for Annual Physical.  If you have any other questions or concerns, please feel free to call the clinic or send a message through [place]. You may also schedule an earlier appointment if necessary.  Additionally, you may be receiving a survey about your experience at our clinic within a few days to 1 week by e-mail or mail. We value your feedback.  Nobie Putnam, DO [place]

## 2016-11-08 NOTE — Assessment & Plan Note (Signed)
Less likely cause now with dental x-rays

## 2016-11-08 NOTE — Assessment & Plan Note (Addendum)
A1c stable previously, last 5.7 in 04/2016 Encourage continue current lifestyle diet improve exercise Next check in 6 months with annual

## 2016-12-03 ENCOUNTER — Other Ambulatory Visit: Payer: Self-pay | Admitting: Family Medicine

## 2016-12-03 DIAGNOSIS — M26621 Arthralgia of right temporomandibular joint: Secondary | ICD-10-CM

## 2017-01-11 LAB — HM MAMMOGRAPHY

## 2017-01-19 ENCOUNTER — Encounter: Payer: Self-pay | Admitting: Family Medicine

## 2017-03-30 ENCOUNTER — Other Ambulatory Visit: Payer: Self-pay | Admitting: Family Medicine

## 2017-03-30 DIAGNOSIS — J4520 Mild intermittent asthma, uncomplicated: Secondary | ICD-10-CM

## 2017-03-30 DIAGNOSIS — J3089 Other allergic rhinitis: Secondary | ICD-10-CM

## 2017-05-08 ENCOUNTER — Other Ambulatory Visit: Payer: BC Managed Care – PPO

## 2017-05-12 ENCOUNTER — Other Ambulatory Visit: Payer: Self-pay

## 2017-05-12 ENCOUNTER — Encounter: Payer: BC Managed Care – PPO | Admitting: Family Medicine

## 2017-05-12 DIAGNOSIS — Z789 Other specified health status: Secondary | ICD-10-CM

## 2017-05-12 DIAGNOSIS — R7303 Prediabetes: Secondary | ICD-10-CM

## 2017-05-12 DIAGNOSIS — E7800 Pure hypercholesterolemia, unspecified: Secondary | ICD-10-CM

## 2017-05-12 DIAGNOSIS — I10 Essential (primary) hypertension: Secondary | ICD-10-CM

## 2017-05-12 DIAGNOSIS — Z0000 Encounter for general adult medical examination without abnormal findings: Secondary | ICD-10-CM

## 2017-05-15 ENCOUNTER — Other Ambulatory Visit: Payer: BC Managed Care – PPO

## 2017-05-15 LAB — LIPID PANEL
Cholesterol: 172 mg/dL (ref <200)
HDL: 49 mg/dL — ABNORMAL LOW (ref >50)
LDL CHOLESTEROL (CALC): 104 mg/dL — AB
Non-HDL Cholesterol (Calc): 123 mg/dL (calc) (ref <130)
Total CHOL/HDL Ratio: 3.5 (calc) (ref <5.0)
Triglycerides: 94 mg/dL (ref <150)

## 2017-05-15 LAB — COMPLETE METABOLIC PANEL WITH GFR
AG Ratio: 1.6 (calc) (ref 1.0–2.5)
ALT: 15 U/L (ref 6–29)
AST: 15 U/L (ref 10–35)
Albumin: 4.7 g/dL (ref 3.6–5.1)
Alkaline phosphatase (APISO): 62 U/L (ref 33–130)
BUN: 17 mg/dL (ref 7–25)
CALCIUM: 10.2 mg/dL (ref 8.6–10.4)
CO2: 32 mmol/L (ref 20–32)
Chloride: 101 mmol/L (ref 98–110)
Creat: 0.84 mg/dL (ref 0.50–1.05)
GFR, EST AFRICAN AMERICAN: 89 mL/min/{1.73_m2} (ref >=60)
GFR, EST NON AFRICAN AMERICAN: 77 mL/min/{1.73_m2} (ref >=60)
GLUCOSE: 102 mg/dL — AB (ref 65–99)
Globulin: 2.9 g/dL (calc) (ref 1.9–3.7)
Potassium: 4 mmol/L (ref 3.5–5.3)
Sodium: 139 mmol/L (ref 135–146)
TOTAL PROTEIN: 7.6 g/dL (ref 6.1–8.1)
Total Bilirubin: 0.4 mg/dL (ref 0.2–1.2)

## 2017-05-15 LAB — CBC WITH DIFFERENTIAL/PLATELET
BASOS PCT: 0.6 %
Basophils Absolute: 31 cells/uL (ref 0–200)
Eosinophils Absolute: 168 cells/uL (ref 15–500)
Eosinophils Relative: 3.3 %
HCT: 38.9 % (ref 35.0–45.0)
Hemoglobin: 13 g/dL (ref 11.7–15.5)
Lymphs Abs: 1862 cells/uL (ref 850–3900)
MCH: 26.9 pg — ABNORMAL LOW (ref 27.0–33.0)
MCHC: 33.4 g/dL (ref 32.0–36.0)
MCV: 80.5 fL (ref 80.0–100.0)
MONOS PCT: 9.4 %
MPV: 11.4 fL (ref 7.5–12.5)
NEUTROS ABS: 2560 {cells}/uL (ref 1500–7800)
Neutrophils Relative %: 50.2 %
PLATELETS: 280 10*3/uL (ref 140–400)
RBC: 4.83 10*6/uL (ref 3.80–5.10)
RDW: 14.4 % (ref 11.0–15.0)
TOTAL LYMPHOCYTE: 36.5 %
WBC: 5.1 10*3/uL (ref 3.8–10.8)
WBCMIX: 479 {cells}/uL (ref 200–950)

## 2017-05-16 LAB — HEMOGLOBIN A1C
Hgb A1c MFr Bld: 6 % of total Hgb — ABNORMAL HIGH (ref <5.7)
MEAN PLASMA GLUCOSE: 126 (calc)
eAG (mmol/L): 7 (calc)

## 2017-05-16 LAB — HEPATITIS B SURFACE ANTIBODY,QUALITATIVE: Hep B S Ab: NONREACTIVE

## 2017-05-19 ENCOUNTER — Encounter: Payer: Self-pay | Admitting: Family Medicine

## 2017-05-19 ENCOUNTER — Encounter: Payer: BC Managed Care – PPO | Admitting: Family Medicine

## 2017-05-19 ENCOUNTER — Ambulatory Visit (INDEPENDENT_AMBULATORY_CARE_PROVIDER_SITE_OTHER): Payer: BC Managed Care – PPO | Admitting: Family Medicine

## 2017-05-19 VITALS — BP 120/63 | HR 79 | Temp 97.8°F | Resp 16 | Ht 64.0 in | Wt 127.0 lb

## 2017-05-19 DIAGNOSIS — M62838 Other muscle spasm: Secondary | ICD-10-CM

## 2017-05-19 DIAGNOSIS — I10 Essential (primary) hypertension: Secondary | ICD-10-CM

## 2017-05-19 DIAGNOSIS — R7303 Prediabetes: Secondary | ICD-10-CM

## 2017-05-19 DIAGNOSIS — Z0000 Encounter for general adult medical examination without abnormal findings: Secondary | ICD-10-CM | POA: Diagnosis not present

## 2017-05-19 DIAGNOSIS — E7800 Pure hypercholesterolemia, unspecified: Secondary | ICD-10-CM

## 2017-05-19 NOTE — Patient Instructions (Addendum)
Thank you for coming to the office today.  Hepatitis B Antibody was NOT reactive - you are not immune to this. May get Hep B vaccine here or return for nurse visit - will need one dose, and then repeat 3rd dose after 2 months.  For neck muscles in back of head, most likely caused by stress and muscle tension especially using computer at work, can be positional too with resting and sleeping.  Try to use massager in this area as we demonstrated, use warm moist heat and massage, plenty of hydration as well to keep muscles healthy and avoid spasm or strain.  A1c 6.0, slightly elevated from last result. Try to reduce starches and sugars as we talked, and limit soda or quit, more water. See list of food options.  Cholesterol is good, do not need any other medicine. Continue Aspirin   Please schedule a Follow-up Appointment to: Return in about 3 months (around 08/19/2017) for PreDM A1c, Hep B vaccine.  If you have any other questions or concerns, please feel free to call the office or send a message through [place]. You may also schedule an earlier appointment if necessary.  Additionally, you may be receiving a survey about your experience at our office within a few days to 1 week by e-mail or mail. We value your feedback.  Nobie Putnam, DO [place]

## 2017-05-19 NOTE — Progress Notes (Signed)
Subjective:    Patient ID: Regina Barber, female    DOB: [birth year], 57 y.o.   MRN: [id number]  Karl Knarr is a 57 y.o. female presenting on 05/19/2017 for Annual Exam  HPI   Here for Annual Physical and Lab Review.   CHRONIC HTN: Reports no new concerns. Not checking BP outside office. Current Meds - HCTZ 25mg , Lisinopril 20mg    Reports good compliance, took meds today. Tolerating well, w/o complaints.  Posterior neck pain / tension Reports recent problem with intermittent episodes muscle spasm in back of neck or tightness feels pressure, few minutes to hours, improve with motrin and rest. Thinks due to strain at work or other activities and sleeping position.  Pre-Diabetes Reports no new concerns. Her A1c is up to 6.0, previous 5.7 to 6.1 CBGs: Not checking Meds: never on meds Currently on ACEi Taking ASA 81 Lifestyle: - Diet (Drinks mostly water every day. Some starchy foods she eats banana regularly, cereals, oatmeal) - Exercise (Walking regularly at work) Denies hypoglycemia  HYPERLIPIDEMIA: - Reports no concerns. Last lipid panel 04/2017, controlled  - Taking ASA 81 . Never on cholesterol med   Health Maintenance:  UTD Colonoscopy  UTD Mammogram  UTD Hep C screening  Hep B non immune (titer is non reactive) only s/p 1 dose in 03/2015. Will be due for repeat Hep B vaccine series 2nd and 3rd doses - next dose at future visit in 3 month then repeat in 2 months  Depression screen [facility] 2/9 05/19/2017 11/08/2016 11/09/2015  Decreased Interest 0 0 0  Down, Depressed, Hopeless 0 0 0  PHQ - 2 Score 0 0 0    Past Medical History:  Diagnosis Date  . Allergy   . Asthma   . Genital warts   . Hypertension    Past Surgical History:  Procedure Laterality Date  . ABDOMINAL HYSTERECTOMY  1987   Bilateral ovaries spared, reportedly cervix removed.  . COLONOSCOPY WITH PROPOFOL N/A 06/27/2016   Procedure: COLONOSCOPY WITH PROPOFOL;  Surgeon: Lucilla Lame, MD;   Location: [facility];  Service: Endoscopy;  Laterality: N/A;  . POLYPECTOMY  06/27/2016   Procedure: POLYPECTOMY;  Surgeon: Lucilla Lame, MD;  Location: [facility];  Service: Endoscopy;;   Social History   Socioeconomic History  . Marital status: Married    Spouse name: Not on file  . Number of children: 2  . Years of education: Associate's Degree  . Highest education level: Not on file  Occupational History  . Occupation: School Custodian  Social Needs  . Financial resource strain: Not on file  . Food insecurity:    Worry: Not on file    Inability: Not on file  . Transportation needs:    Medical: Not on file    Non-medical: Not on file  Tobacco Use  . Smoking status: Former Smoker    Packs/day: 1.00    Years: 30.00    Pack years: 30.00    Types: Cigarettes    Last attempt to quit: 2008    Years since quitting: 11.3  . Smokeless tobacco: Former Systems developer  . Tobacco comment: Quit on own after birth of daughter  Substance and Sexual Activity  . Alcohol use: No  . Drug use: No    Comment: Former history of substance use  . Sexual activity: Not on file  Lifestyle  . Physical activity:    Days per week: Not on file    Minutes per session: Not on file  . Stress:  Not on file  Relationships  . Social connections:    Talks on phone: Not on file    Gets together: Not on file    Attends religious service: Not on file    Active member of club or organization: Not on file    Attends meetings of clubs or organizations: Not on file    Relationship status: Not on file  . Intimate partner violence:    Fear of current or ex partner: Not on file    Emotionally abused: Not on file    Physically abused: Not on file    Forced sexual activity: Not on file  Other Topics Concern  . Not on file  Social History Narrative  . Not on file   Family History  Problem Relation Age of Onset  . Cirrhosis Father 61   Current Outpatient Medications on File Prior to Visit    Medication Sig  . albuterol (PROVENTIL HFA;VENTOLIN HFA) 108 (90 Base) MCG/ACT inhaler Inhale 1-2 puffs into the lungs every 4 (four) hours as needed for wheezing or shortness of breath.  Marland Kitchen aspirin EC 81 MG tablet Take 81 mg by mouth daily.  . cetirizine (ZYRTEC) 10 MG tablet Take 10 mg by mouth daily.  . hydrochlorothiazide (HYDRODIURIL) 25 MG tablet TAKE 1 TABLET (25 MG TOTAL) BY MOUTH DAILY.  Marland Kitchen ibuprofen (ADVIL,MOTRIN) 600 MG tablet TAKE 1 TABLET (600 MG TOTAL) BY MOUTH EVERY 8 (EIGHT) HOURS AS NEEDED. WITH FOOD  . lisinopril (PRINIVIL,ZESTRIL) 20 MG tablet TAKE 1 TABLET (20 MG TOTAL) BY MOUTH DAILY.  . montelukast (SINGULAIR) 10 MG tablet TAKE 1 TABLET BY MOUTH EVERYDAY AT BEDTIME   No current facility-administered medications on file prior to visit.     Review of Systems  Constitutional: Negative for activity change, appetite change, chills, diaphoresis, fatigue and fever.  HENT: Negative for congestion and hearing loss.   Eyes: Negative for visual disturbance.  Respiratory: Negative for cough, chest tightness, shortness of breath and wheezing.   Cardiovascular: Negative for chest pain, palpitations and leg swelling.  Gastrointestinal: Negative for abdominal pain, anal bleeding, blood in stool, constipation, diarrhea, nausea and vomiting.  Endocrine: Negative for cold intolerance.  Genitourinary: Negative for decreased urine volume, dysuria, frequency, hematuria, vaginal bleeding and vaginal discharge.  Musculoskeletal: Negative for arthralgias and neck pain.       Mild neck pain at occiput with spasm, mostly resolved now  Skin: Negative for rash.  Allergic/Immunologic: Positive for environmental allergies.  Neurological: Negative for dizziness, weakness, light-headedness, numbness and headaches.  Hematological: Negative for adenopathy.  Psychiatric/Behavioral: Negative for behavioral problems, dysphoric mood and sleep disturbance. The patient is not nervous/anxious.    Per HPI  unless specifically indicated above      Objective:    BP 120/63   Pulse 79   Temp 97.8 F (36.6 C) (Oral)   Resp 16   Ht 5\' 4"  (1.626 m)   Wt 127 lb (57.6 kg)   BMI 21.80 kg/m   Wt Readings from Last 3 Encounters:  05/19/17 127 lb (57.6 kg)  11/08/16 129 lb (58.5 kg)  09/26/16 132 lb (59.9 kg)    Physical Exam  Constitutional: She is oriented to person, place, and time. She appears well-developed and well-nourished. No distress.  Well-appearing, comfortable, cooperative  HENT:  Head: Normocephalic and atraumatic.  Mouth/Throat: Oropharynx is clear and moist.  Eyes: Pupils are equal, round, and reactive to light. Conjunctivae and EOM are normal. Right eye exhibits no discharge. Left eye exhibits no  discharge.  Neck: Normal range of motion. Neck supple. No thyromegaly present.  Localized mild muscle hypertonicity minimal tender base of occiput bilateral upper neck muscles  Cardiovascular: Normal rate, regular rhythm, normal heart sounds and intact distal pulses.  No murmur heard. Pulmonary/Chest: Effort normal and breath sounds normal. No respiratory distress. She has no wheezes. She has no rales.  Abdominal: Soft. Bowel sounds are normal. She exhibits no distension and no mass. There is no tenderness.  Musculoskeletal: Normal range of motion. She exhibits no edema or tenderness.  Upper / Lower Extremities: - Normal muscle tone, strength bilateral upper extremities 5/5, lower extremities 5/5  Lymphadenopathy:    She has no cervical adenopathy.  Neurological: She is alert and oriented to person, place, and time.  Distal sensation intact to light touch all extremities  Skin: Skin is warm and dry. No rash noted. She is not diaphoretic. No erythema.  Psychiatric: She has a normal mood and affect. Her behavior is normal.  Well groomed, good eye contact, normal speech and thoughts  Nursing note and vitals reviewed.  Results for orders placed or performed in visit on 05/12/17    Hepatitis B surface antibody  Result Value Ref Range   Hep B S Ab NON-REACTIVE NON-REACTI  Lipid panel  Result Value Ref Range   Cholesterol 172 <200 mg/dL   HDL 49 (L) >50 mg/dL   Triglycerides 94 <150 mg/dL   LDL Cholesterol (Calc) 104 (H) mg/dL (calc)   Total CHOL/HDL Ratio 3.5 <5.0 (calc)   Non-HDL Cholesterol (Calc) 123 <130 mg/dL (calc)  CBC with Differential/Platelet  Result Value Ref Range   WBC 5.1 3.8 - 10.8 Thousand/uL   RBC 4.83 3.80 - 5.10 Million/uL   Hemoglobin 13.0 11.7 - 15.5 g/dL   HCT 38.9 35.0 - 45.0 %   MCV 80.5 80.0 - 100.0 fL   MCH 26.9 (L) 27.0 - 33.0 pg   MCHC 33.4 32.0 - 36.0 g/dL   RDW 14.4 11.0 - 15.0 %   Platelets 280 140 - 400 Thousand/uL   MPV 11.4 7.5 - 12.5 fL   Neutro Abs 2,560 1,500 - 7,800 cells/uL   Lymphs Abs 1,862 850 - 3,900 cells/uL   WBC mixed population 479 200 - 950 cells/uL   Eosinophils Absolute 168 15 - 500 cells/uL   Basophils Absolute 31 0 - 200 cells/uL   Neutrophils Relative % 50.2 %   Total Lymphocyte 36.5 %   Monocytes Relative 9.4 %   Eosinophils Relative 3.3 %   Basophils Relative 0.6 %  Hemoglobin A1c  Result Value Ref Range   Hgb A1c MFr Bld 6.0 (H) <5.7 % of total Hgb   Mean Plasma Glucose 126 (calc)   eAG (mmol/L) 7.0 (calc)  COMPLETE METABOLIC PANEL WITH GFR  Result Value Ref Range   Glucose, Bld 102 (H) 65 - 99 mg/dL   BUN 17 7 - 25 mg/dL   Creat 0.84 0.50 - 1.05 mg/dL   GFR, Est Non African American 77 > OR = 60 mL/min/1.81m2   GFR, Est African American 89 > OR = 60 mL/min/1.66m2   BUN/Creatinine Ratio NOT APPLICABLE 6 - 22 (calc)   Sodium 139 135 - 146 mmol/L   Potassium 4.0 3.5 - 5.3 mmol/L   Chloride 101 98 - 110 mmol/L   CO2 32 20 - 32 mmol/L   Calcium 10.2 8.6 - 10.4 mg/dL   Total Protein 7.6 6.1 - 8.1 g/dL   Albumin 4.7 3.6 - 5.1 g/dL  Globulin 2.9 1.9 - 3.7 g/dL (calc)   AG Ratio 1.6 1.0 - 2.5 (calc)   Total Bilirubin 0.4 0.2 - 1.2 mg/dL   Alkaline phosphatase (APISO) 62 33 - 130 U/L   AST  15 10 - 35 U/L   ALT 15 6 - 29 U/L      Assessment & Plan:   Problem List Items Addressed This Visit    Hyperlipidemia    Controlled cholesterol on lifestyle Last lipid panel 04/2017 Calculated ASCVD 10 yr risk score 3.7% lower risk  Plan: 1. Not indicated for statin 2. Continue ASA 81mg  for primary ASCVD risk reduction 3. Encourage improved lifestyle - low carb/cholesterol, reduce portion size, continue improving regular exercise 4. Follow-up yearly lipid      Hypertension    Well-controlled HTN - Home BP readings none  No known complications   Plan:  1. Continue current BP regimen HCTZ 25mg , Lisinopril 20mg  - future may adjust dose if needed lower meds likely reduce thiazide 2. Encourage improved lifestyle - low sodium diet, regular exercise 3. STart monitor BP outside office, bring readings to next visit, if persistently >140/90 or new symptoms notify office sooner 4. Follow-up 3 mo A1c but will check BP      Pre-diabetes    Mild worsening control Pre-DM with A1c 6.0 from prior 5.5 range   Plan:  1. Not on any therapy currently  2. Encourage improved lifestyle - low carb, low sugar diet, reduce portion size, continue improving regular exercise - handout given glycemic index food groups, portion size DM diet 3. Follow-up 3 months A1c trend - if improved then space to q 6       Other Visit Diagnoses    Annual physical exam    -  Primary Up to date health maintenance Will give Hep B 2nd dose vaccine since not immune at next visit then 3rd dose in 2 mo Reviewed labs Encourage keep improving diet exercise lifestyle    Neck muscle spasm     Reassurance, seems benign MSK tension spasm in postural neck muscles Activity modification, massage, moist heat, rest, avoid strain Follow-up      No orders of the defined types were placed in this encounter.   Follow up plan: Return in about 3 months (around 08/19/2017) for PreDM A1c, Hep B vaccine.  Nobie Putnam,  [facility] 05/20/2017, 11:20 AM

## 2017-05-20 NOTE — Assessment & Plan Note (Signed)
Mild worsening control Pre-DM with A1c 6.0 from prior 5.5 range   Plan:  1. Not on any therapy currently  2. Encourage improved lifestyle - low carb, low sugar diet, reduce portion size, continue improving regular exercise - handout given glycemic index food groups, portion size DM diet 3. Follow-up 3 months A1c trend - if improved then space to q 6

## 2017-05-20 NOTE — Assessment & Plan Note (Signed)
Controlled cholesterol on lifestyle Last lipid panel 04/2017 Calculated ASCVD 10 yr risk score 3.7% lower risk  Plan: 1. Not indicated for statin 2. Continue ASA 81mg  for primary ASCVD risk reduction 3. Encourage improved lifestyle - low carb/cholesterol, reduce portion size, continue improving regular exercise 4. Follow-up yearly lipid

## 2017-05-20 NOTE — Assessment & Plan Note (Signed)
Well-controlled HTN - Home BP readings none  No known complications   Plan:  1. Continue current BP regimen HCTZ 25mg , Lisinopril 20mg  - future may adjust dose if needed lower meds likely reduce thiazide 2. Encourage improved lifestyle - low sodium diet, regular exercise 3. STart monitor BP outside office, bring readings to next visit, if persistently >140/90 or new symptoms notify office sooner 4. Follow-up 3 mo A1c but will check BP

## 2017-08-18 ENCOUNTER — Ambulatory Visit: Payer: BC Managed Care – PPO | Admitting: Family Medicine

## 2017-08-23 ENCOUNTER — Ambulatory Visit: Payer: BC Managed Care – PPO | Admitting: Family Medicine

## 2017-08-23 ENCOUNTER — Encounter: Payer: Self-pay | Admitting: Family Medicine

## 2017-08-23 VITALS — BP 100/59 | HR 78 | Temp 98.6°F | Resp 16 | Ht 64.0 in | Wt 125.0 lb

## 2017-08-23 DIAGNOSIS — Z9229 Personal history of other drug therapy: Secondary | ICD-10-CM | POA: Diagnosis not present

## 2017-08-23 DIAGNOSIS — I10 Essential (primary) hypertension: Secondary | ICD-10-CM | POA: Diagnosis not present

## 2017-08-23 DIAGNOSIS — Z23 Encounter for immunization: Secondary | ICD-10-CM | POA: Diagnosis not present

## 2017-08-23 DIAGNOSIS — R7303 Prediabetes: Secondary | ICD-10-CM

## 2017-08-23 LAB — POCT GLYCOSYLATED HEMOGLOBIN (HGB A1C)

## 2017-08-23 MED ORDER — HEPATITIS B VAC RECOMBINANT 10 MCG/ML IJ SUSP: 1.0000 mL | Freq: Once | INTRAMUSCULAR | Status: DC

## 2017-08-23 NOTE — Patient Instructions (Addendum)
Thank you for coming to the office today.  Fingerstick A1c today was higher 6.4 - concerning that this is elevated, seems inconsistent  We will recheck it through the vein with a lab draw to confirm that it is elevated next  If it is higher >6.2 then we could offer a Metformin medication - 500mg  daily with food, may start with 1 pill and then may take up to one pill twice a day with meals.  This can be temporary.  Keep trying to make diet and exercise changes to improve this overall to control it on your own in the future.  #2 dose of hep b vaccine today - next one due in 3 months  DUE for FASTING BLOOD WORK (no food or drink after midnight before the lab appointment, only water or coffee without cream/sugar on the morning of)  SCHEDULE "Lab Only" visit in the morning at the clinic for lab draw in 3 MONTHS   - Make sure Lab Only appointment is at about 1 week before your next appointment, so that results will be available  For Lab Results, once available within 2-3 days of blood draw, you can can log in to MyChart online to view your results and a brief explanation. Also, we can discuss results at next follow-up visit.   Please schedule a Follow-up Appointment to: Return in about 3 months (around 11/23/2017) for PreDM (lab follow-up) / Hep B vaccine (final #3).  If you have any other questions or concerns, please feel free to call the office or send a message through [place]. You may also schedule an earlier appointment if necessary.  Additionally, you may be receiving a survey about your experience at our office within a few days to 1 week by e-mail or mail. We value your feedback.  Nobie Putnam, DO [place]

## 2017-08-23 NOTE — Assessment & Plan Note (Signed)
Concerning elevated A1c from 6.0 up to 6.4 on POC A1c today, seems inconsistent with home lifestyle change reported. Also with weight loss with improved lifestyle.  Plan 1. Discontiue POC A1c today - had ordering error, one unable to remove from system but deleted result, order was not cosigned 2. Re-order Serum A1c today to be drawn by lab to confirm to clarify current A1c - will follow-up result - if serum A1c >6.2 then offered metformin therapy 500mg  up to BID wc to start as a temporary option to control sugar while she continues to improve lifestyle - Encourage continue low sugar low starch diet, reduce portions, stay active, improve walking - Follow-up in 3 months for repeat serum A1c for trend

## 2017-08-23 NOTE — Assessment & Plan Note (Signed)
Well-controlled HTN - mild low BP currently but asymptomatic - Home BP readings none  No known complications   Plan:  1. Continue current BP regimen HCTZ 25mg , Lisinopril 20mg  - future may adjust dose if needed lower meds likely reduce thiazide 2. Encourage improved lifestyle - low sodium diet, regular exercise 3. Monitor BP outside office, bring readings to next visit, if persistently >140/90 or new symptoms notify office sooner 4. Follow-up 3 mo

## 2017-08-23 NOTE — Progress Notes (Signed)
Subjective:    Patient ID: Regina Barber, female    DOB: [birth year], 57 y.o.   MRN: [id number]  Regina Barber is a 57 y.o. female presenting on 08/23/2017 for Pre-Diabetes and Immunizations (Need hep b vaccine #2)   HPI   Pre-Diabetes Reports no new concerns. Previous A1c up to 6.0, she has tried to improve this with lifestyle. CBGs: Not checking Meds: never on meds Currently on ACEi Taking ASA 81 Lifestyle: - Diet (Reports doing well, limiting starchy foods, eats mostly vegetables, fish for protein, grilling more meats and less fried foods, drinks mostly water, occasional gatorade, some coffee some sugar) - Exercise (Walking regularly at work) Denies hypoglycemia, polyuria, visual changes, numbness or tingling.  CHRONIC HTN: Reports doing well on BP meds, not checking BP regularly Current Meds - Lisinopril 20mg  daily, HCTZ 25mg  daily   Reports good compliance, took meds today. Tolerating well, w/o complaints. Denies CP, dyspnea, HA, edema, dizziness / lightheadedness  Needs Hepatitis B Vaccination - History of prior Hepatitis B vaccine (2017) repeat Hep B titer was negative demonstrating no immunity, now due for repeat series with 2nd dose today and then will need repeat in 2 months for 3rd dose  Health Maintenance: Due Flu Shot will return for this.  Depression screen [facility] 2/9 08/23/2017 05/19/2017 11/08/2016  Decreased Interest 0 0 0  Down, Depressed, Hopeless 0 0 0  PHQ - 2 Score 0 0 0    Social History   Tobacco Use  . Smoking status: Former Smoker    Packs/day: 1.00    Years: 30.00    Pack years: 30.00    Types: Cigarettes    Last attempt to quit: 2008    Years since quitting: 11.6  . Smokeless tobacco: Former Systems developer  . Tobacco comment: Quit on own after birth of daughter  Substance Use Topics  . Alcohol use: No  . Drug use: No    Comment: Former history of substance use    Review of Systems Per HPI unless specifically indicated above     Objective:      BP (!) 100/59   Pulse 78   Temp 98.6 F (37 C) (Oral)   Resp 16   Ht 5\' 4"  (1.626 m)   Wt 125 lb (56.7 kg)   BMI 21.46 kg/m   Wt Readings from Last 3 Encounters:  08/23/17 125 lb (56.7 kg)  05/19/17 127 lb (57.6 kg)  11/08/16 129 lb (58.5 kg)    Physical Exam  Constitutional: She is oriented to person, place, and time. She appears well-developed and well-nourished. No distress.  Well-appearing, comfortable, cooperative  HENT:  Head: Normocephalic and atraumatic.  Mouth/Throat: Oropharynx is clear and moist.  Eyes: Conjunctivae are normal. Right eye exhibits no discharge. Left eye exhibits no discharge.  Cardiovascular: Normal rate.  Pulmonary/Chest: Effort normal.  Musculoskeletal: She exhibits no edema.  Neurological: She is alert and oriented to person, place, and time.  Skin: Skin is warm and dry. No rash noted. She is not diaphoretic. No erythema.  Psychiatric: She has a normal mood and affect. Her behavior is normal.  Well groomed, good eye contact, normal speech and thoughts  Nursing note and vitals reviewed.  Results for orders placed or performed in visit on 08/23/17  POCT HgB A1C  Result Value Ref Range   Hemoglobin A1C  4.0 - 5.6 %   HbA1c POC (<> result, manual entry)  4.0 - 5.6 %   HbA1c, POC (prediabetic range)  5.7 - 6.4 %  HbA1c, POC (controlled diabetic range)  0.0 - 7.0 %      Assessment & Plan:   Problem List Items Addressed This Visit    Hypertension    Well-controlled HTN - mild low BP currently but asymptomatic - Home BP readings none  No known complications   Plan:  1. Continue current BP regimen HCTZ 25mg , Lisinopril 20mg  - future may adjust dose if needed lower meds likely reduce thiazide 2. Encourage improved lifestyle - low sodium diet, regular exercise 3. Monitor BP outside office, bring readings to next visit, if persistently >140/90 or new symptoms notify office sooner 4. Follow-up 3 mo      Pre-diabetes - Primary    Concerning  elevated A1c from 6.0 up to 6.4 on POC A1c today, seems inconsistent with home lifestyle change reported. Also with weight loss with improved lifestyle.  Plan 1. Discontiue POC A1c today - had ordering error, one unable to remove from system but deleted result, order was not cosigned 2. 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 DISCONTD: hepatitis b vaccine for adults (RECOMBIVAX-HB) injection 10 mcg    Follow up plan: Return in about 3 months (around 11/23/2017) for PreDM (lab follow-up) / Hep B vaccine (final #3).  Future labs ordered for 3 months A1c  Nobie Putnam, [facility] 08/23/2017, 9:00 AM

## 2017-08-24 ENCOUNTER — Other Ambulatory Visit: Payer: Self-pay | Admitting: Family Medicine

## 2017-08-24 DIAGNOSIS — R7303 Prediabetes: Secondary | ICD-10-CM

## 2017-08-24 LAB — HEMOGLOBIN A1C
EAG (MMOL/L): 7 (calc)
HEMOGLOBIN A1C: 6 %{Hb} — AB (ref <5.7)
MEAN PLASMA GLUCOSE: 126 (calc)

## 2017-10-11 ENCOUNTER — Other Ambulatory Visit: Payer: Self-pay | Admitting: Family Medicine

## 2017-10-11 DIAGNOSIS — J3089 Other allergic rhinitis: Secondary | ICD-10-CM

## 2017-10-11 DIAGNOSIS — J4520 Mild intermittent asthma, uncomplicated: Secondary | ICD-10-CM

## 2017-11-10 ENCOUNTER — Other Ambulatory Visit: Payer: Self-pay | Admitting: Family Medicine

## 2017-11-10 DIAGNOSIS — I10 Essential (primary) hypertension: Secondary | ICD-10-CM

## 2017-11-13 ENCOUNTER — Encounter: Payer: Self-pay | Admitting: Family Medicine

## 2017-11-13 ENCOUNTER — Ambulatory Visit: Payer: BC Managed Care – PPO | Admitting: Family Medicine

## 2017-11-13 VITALS — BP 121/74 | HR 78 | Temp 98.5°F | Resp 16 | Ht 64.0 in | Wt 124.0 lb

## 2017-11-13 DIAGNOSIS — R221 Localized swelling, mass and lump, neck: Secondary | ICD-10-CM | POA: Diagnosis not present

## 2017-11-13 DIAGNOSIS — R7303 Prediabetes: Secondary | ICD-10-CM

## 2017-11-13 DIAGNOSIS — J3089 Other allergic rhinitis: Secondary | ICD-10-CM | POA: Diagnosis not present

## 2017-11-13 MED ORDER — CETIRIZINE HCL 10 MG PO TABS: 10.0000 mg | ORAL_TABLET | Freq: Every day | ORAL | 0 refills | Status: AC

## 2017-11-13 NOTE — Progress Notes (Signed)
Subjective:    Patient ID: Regina Barber, female    DOB: 06/19/1960, 57 y.o.   MRN: [id number]  Regina Barber is a 57 y.o. female presenting on 11/13/2017 for Cyst (swelling under chin as per patient felt knot on last wednesday but now it's gone)  Accompanied by husband, Ozzie Hoyle, who provides some additional history.  HPI   Neck Mass / "Knot" Reports long history since high school had a "bump" or knot on left anterior aspect of neck, did not bother her for long time. Recently few days ago it bothered her when eating and had a sharp pain, had some associated swelling and some pain on swallowing. - Denies any dysphagia, choking, fevers, chills, nausea vomiting, no other lymph nodes or knots under skin  Pre-Diabetes Last result 6.0, in 08/2017. CBGs:Not checking Meds:never on meds CurrentlyonACEi Taking ASA 81 Lifestyle: - Diet (Reports doing well, limiting starchy foods, eats mostly vegetables, fish for protein, grilling more meats and less fried foods, drinks mostly water, occasional gatorade, some coffee some sugar) - Exercise (Walking regularly at work) Denies hypoglycemia, polyuria, visual changes, numbness or tingling.  Seasonal Allergies / Eustachian Tube Dysfunction History of allergies, and effusion in past, has been on singulair for allergies, no longer taking Zyrtec OTC. She cannot take nose sprays such as flonase, declines due to not wanting to use any nose spray, given she states her past history of drug use. Admits some ear fullness and fluid at times or pressure - Denies any sinus pain or pressure or purulent drainage, fever chills, hearing loss  Health Maintenance: Defers Flu Vaccine - will get on 11/8  Depression screen [place] 2/9 08/23/2017 05/19/2017 11/08/2016  Decreased Interest 0 0 0  Down, Depressed, Hopeless 0 0 0  PHQ - 2 Score 0 0 0    Social History   Tobacco Use  . Smoking status: Former Smoker    Packs/day: 1.00    Years: 30.00    Pack years:  30.00    Types: Cigarettes    Last attempt to quit: 2008    Years since quitting: 11.8  . Smokeless tobacco: Former Systems developer  . Tobacco comment: Quit on own after birth of daughter  Substance Use Topics  . Alcohol use: No  . Drug use: No    Comment: Former history of substance use    Review of Systems Per HPI unless specifically indicated above     Objective:    BP 121/74   Pulse 78   Temp 98.5 F (36.9 C) (Oral)   Resp 16   Ht 5\' 4"  (1.626 m)   Wt 124 lb (56.2 kg)   BMI 21.28 kg/m   Wt Readings from Last 3 Encounters:  11/13/17 124 lb (56.2 kg)  08/23/17 125 lb (56.7 kg)  05/19/17 127 lb (57.6 kg)    Physical Exam  Constitutional: She is oriented to person, place, and time. She appears well-developed and well-nourished. No distress.  Well-appearing, comfortable, cooperative  HENT:  Head: Normocephalic and atraumatic.  Mouth/Throat: Oropharynx is clear and moist.  Frontal / maxillary sinuses non-tender. Nares patent without purulence or edema. Bilateral TMs clear without erythema or bulging, only minimal effusion bilateral L>R. Oropharynx clear without erythema, exudates, edema or asymmetry.  Eyes: Conjunctivae are normal. Right eye exhibits no discharge. Left eye exhibits no discharge.  Neck: Normal range of motion. Neck supple. No thyromegaly present.  No palpable anterior cervical or posterior cervical lymph node. No abnormality palpable on exam. Non tender. No edema.  Cardiovascular: Intact distal pulses.  Pulmonary/Chest: Effort normal. No respiratory distress.  Musculoskeletal: She exhibits no edema.  Lymphadenopathy:    She has no cervical adenopathy.  Neurological: She is alert and oriented to person, place, and time.  Skin: Skin is warm and dry. No rash noted. She is not diaphoretic. No erythema.  Psychiatric: She has a normal mood and affect. Her behavior is normal.  Well groomed, good eye contact, normal speech and thoughts  Nursing note and vitals  reviewed.  Results for orders placed or performed in visit on 08/23/17  Hemoglobin A1c  Result Value Ref Range   Hgb A1c MFr Bld 6.0 (H) <5.7 % of total Hgb   Mean Plasma Glucose 126 (calc)   eAG (mmol/L) 7.0 (calc)  POCT HgB A1C  Result Value Ref Range   Hemoglobin A1C  4.0 - 5.6 %   HbA1c POC (<> result, manual entry)  4.0 - 5.6 %   HbA1c, POC (prediabetic range)  5.7 - 6.4 %   HbA1c, POC (controlled diabetic range)  0.0 - 7.0 %      Assessment & Plan:   Problem List Items Addressed This Visit    Environmental and seasonal allergies Clinically mild symptoms without sinusitis or other complicating factor. Seems to be mostly allergic related Continue Singulair nightly Add back anti histamine Zyrtec AM daily OTC Offered flonase but she has declnied    Relevant Medications   cetirizine (ZYRTEC) 10 MG tablet   Pre-diabetes Previously stable A1c Patient elected for A1c today release prior order earlier, than returning in November for lab draw Improving lifestyle Follow-up at next visit     Other Visit Diagnoses    Localized swelling, mass and lump, neck    -  Primary  Suspected benign isolated acute L anterior cervical LAD vs possible salivary gland etiology - question given she reports it seems to have been present for >30+ years or more, but seems never caused problem until recently only temporary, unsure about timeline on history but it seems relatively benign, now asymptomatic and not present on exam - No additional concerning symptoms for more systemic problem vs malignancy.   Plan: 1. Reassurance, likely self limited within 4-6 weeks, no treatment today 2. Monitor for change, inc size, pain, swelling, other LAD, worsening symptoms, return criteria given 3. Follow-up if not resolving in 4-6 weeks to re-check - consider [place] vs referral in future if need ENT       Meds ordered this encounter  Medications  . cetirizine (ZYRTEC) 10 MG tablet    Sig: Take 1 tablet (10 mg  total) by mouth daily.    Dispense:  30 tablet    Refill:  0    Follow up plan: Return in about 11 days (around 11/24/2017) for keep apt PreDM on 11/8.  Patient elected to have A1c drawn today to avoid return for lab apt.  Nobie Putnam, [facility] 11/13/2017, 8:28 AM

## 2017-11-13 NOTE — Patient Instructions (Addendum)
Thank you for coming to the office today.  Mild fluid in L ear, should not cause significant problem but can come and go.  May discuss with ENT in future if bothersome.  Start back on Zyrtec in morning, once every day. CONTINUE Singulair 10mg  nightly  For knot / swelling neck - may consider return or notify me if you have worse symptoms return - we can do an Ultrasound of neck and refer to ENT if keeps coming back / bothering you  A1c lab today, f/u on 11/8  Please schedule a Follow-up Appointment to: Return in about 11 days (around 11/24/2017) for keep apt PreDM on 11/8.  If you have any other questions or concerns, please feel free to call the office or send a message through [place]. You may also schedule an earlier appointment if necessary.  Additionally, you may be receiving a survey about your experience at our office within a few days to 1 week by e-mail or mail. We value your feedback.  Nobie Putnam, DO [place]

## 2017-11-14 LAB — HEMOGLOBIN A1C
EAG (MMOL/L): 7 (calc)
Hgb A1c MFr Bld: 6 % of total Hgb — ABNORMAL HIGH (ref <5.7)
Mean Plasma Glucose: 126 (calc)

## 2017-11-15 ENCOUNTER — Other Ambulatory Visit: Payer: Self-pay | Admitting: Nurse Practitioner

## 2017-11-15 DIAGNOSIS — J4520 Mild intermittent asthma, uncomplicated: Secondary | ICD-10-CM

## 2017-11-15 DIAGNOSIS — J3089 Other allergic rhinitis: Secondary | ICD-10-CM

## 2017-11-20 ENCOUNTER — Other Ambulatory Visit: Payer: BC Managed Care – PPO

## 2017-11-24 ENCOUNTER — Ambulatory Visit: Payer: BC Managed Care – PPO | Admitting: Family Medicine

## 2017-11-24 ENCOUNTER — Other Ambulatory Visit: Payer: Self-pay | Admitting: Family Medicine

## 2017-11-24 ENCOUNTER — Encounter: Payer: Self-pay | Admitting: Family Medicine

## 2017-11-24 VITALS — BP 122/64 | HR 85 | Temp 97.6°F | Resp 16 | Ht 64.0 in | Wt 130.0 lb

## 2017-11-24 DIAGNOSIS — E7800 Pure hypercholesterolemia, unspecified: Secondary | ICD-10-CM

## 2017-11-24 DIAGNOSIS — R7303 Prediabetes: Secondary | ICD-10-CM

## 2017-11-24 DIAGNOSIS — N951 Menopausal and female climacteric states: Secondary | ICD-10-CM

## 2017-11-24 DIAGNOSIS — Z23 Encounter for immunization: Secondary | ICD-10-CM

## 2017-11-24 DIAGNOSIS — I10 Essential (primary) hypertension: Secondary | ICD-10-CM

## 2017-11-24 DIAGNOSIS — Z0000 Encounter for general adult medical examination without abnormal findings: Secondary | ICD-10-CM

## 2017-11-24 DIAGNOSIS — M150 Primary generalized (osteo)arthritis: Secondary | ICD-10-CM

## 2017-11-24 DIAGNOSIS — R7989 Other specified abnormal findings of blood chemistry: Secondary | ICD-10-CM

## 2017-11-24 DIAGNOSIS — M159 Polyosteoarthritis, unspecified: Secondary | ICD-10-CM

## 2017-11-24 NOTE — Patient Instructions (Addendum)
Thank you for coming to the office today.  Flu and Hep B vaccines today  As discussed try to work on improving regular exercise - treadmill or machines in gym at work, or home small hand weights and program at home.  Continue with diet plan as discussed  Recent Labs    05/15/17 0851 08/23/17 0856 11/13/17 0909  HGBA1C 6.0* 6.0* 6.0*   DUE for FASTING BLOOD WORK (no food or drink after midnight before the lab appointment, only water or coffee without cream/sugar on the morning of)  SCHEDULE "Lab Only" visit in the morning at the clinic for lab draw in 6 MONTHS   - Make sure Lab Only appointment is at about 1 week before your next appointment, so that results will be available  For Lab Results, once available within 2-3 days of blood draw, you can can log in to MyChart online to view your results and a brief explanation. Also, we can discuss results at next follow-up visit.   Please schedule a Follow-up Appointment to: Return in about 6 months (around 05/25/2018) for Annual Physical.  If you have any other questions or concerns, please feel free to call the office or send a message through [place]. You may also schedule an earlier appointment if necessary.  Additionally, you may be receiving a survey about your experience at our office within a few days to 1 week by e-mail or mail. We value your feedback.  Nobie Putnam, DO [place]

## 2017-11-24 NOTE — Addendum Note (Signed)
Addended by: Olin Hauser on: 11/24/2017 04:42 PM   Modules accepted: Orders

## 2017-11-24 NOTE — Assessment & Plan Note (Signed)
Stable PreDM A1c 6.0 Still think prior A1c 6.4 was inaccurate on POC  Plan:  1. Not on any therapy currently  2. Encourage improved lifestyle - low carb, low sugar diet, reduce portion size, continue improving regular exercise - start gym cardio exercise plan 3. Follow-up 6 months annual labs A1c

## 2017-11-24 NOTE — Progress Notes (Signed)
Subjective:    Patient ID: Regina Barber, female    DOB: Jun 03, 1960, 57 y.o.   MRN: [id number]  Regina Barber is a 57 y.o. female presenting on 11/24/2017 for Pre-Diabetes   HPI   Pre-Diabetes Recent results A1c 6.0, unchanged recently on last lab serum test. Previously had elevated A1c 6.4 inaccurate POC result. CBGs:Not checking Meds:never on meds CurrentlyonACEi Taking ASA 81 Lifestyle: - Diet (Now changes with reduced starches, more meat / vegetable inc amount, fish, less fried, still uses some sugar in coffee but less) - Exercise (Walking regularly at work) Denies hypoglycemia,polyuria, visual changes, numbness or tingling.  Additional complaint - Reports recently while driving to work felt a brief episodic "discomfort" not pain in Left axillary region, she had arm elevated while driving. She did not admit to any associated symptoms or other triggering factors, it resolved and has not returned. She wanted to ask about this.   Health Maintenance:  Due for Flu Shot, will receive today  3rd final Hep B vaccine to be given today, previously had testing for hep b titer in 04/2017 negative   Depression screen [facility] 2/9 08/23/2017 05/19/2017 11/08/2016  Decreased Interest 0 0 0  Down, Depressed, Hopeless 0 0 0  PHQ - 2 Score 0 0 0    Social History   Tobacco Use  . Smoking status: Former Smoker    Packs/day: 1.00    Years: 30.00    Pack years: 30.00    Types: Cigarettes    Last attempt to quit: 2008    Years since quitting: 11.8  . Smokeless tobacco: Former Systems developer  . Tobacco comment: Quit on own after birth of daughter  Substance Use Topics  . Alcohol use: No  . Drug use: No    Comment: Former history of substance use    Review of Systems Per HPI unless specifically indicated above     Objective:    BP 122/64   Pulse 85   Temp 97.6 F (36.4 C) (Oral)   Resp 16   Ht 5\' 4"  (1.626 m)   Wt 130 lb (59 kg)   BMI 22.31 kg/m   Wt Readings from Last 3  Encounters:  11/24/17 130 lb (59 kg)  11/13/17 124 lb (56.2 kg)  08/23/17 125 lb (56.7 kg)    Physical Exam  Constitutional: She is oriented to person, place, and time. She appears well-developed and well-nourished. No distress.  Well-appearing, comfortable, cooperative  HENT:  Head: Normocephalic and atraumatic.  Mouth/Throat: Oropharynx is clear and moist.  Eyes: Conjunctivae are normal. Right eye exhibits no discharge. Left eye exhibits no discharge.  Neck: Normal range of motion. Neck supple. No thyromegaly present.  Cardiovascular: Normal rate, regular rhythm, normal heart sounds and intact distal pulses.  No murmur heard. Pulmonary/Chest: Effort normal and breath sounds normal. No respiratory distress. She has no wheezes. She has no rales.  Musculoskeletal: Normal range of motion. She exhibits no edema.  Lymphadenopathy:    She has no cervical adenopathy.  Neurological: She is alert and oriented to person, place, and time.  Skin: Skin is warm and dry. No rash noted. She is not diaphoretic. No erythema.  Psychiatric: She has a normal mood and affect. Her behavior is normal.  Well groomed, good eye contact, normal speech and thoughts  Nursing note and vitals reviewed.  Results for orders placed or performed in visit on 11/13/17  Hemoglobin A1c  Result Value Ref Range   Hgb A1c MFr Bld 6.0 (H) <5.7 %  of total Hgb   Mean Plasma Glucose 126 (calc)   eAG (mmol/L) 7.0 (calc)      Assessment & Plan:   Problem List Items Addressed This Visit    Pre-diabetes - Primary    Stable PreDM A1c 6.0 Still think prior A1c 6.4 was inaccurate on POC  Plan:  1. Not on any therapy currently  2. Encourage improved lifestyle - low carb, low sugar diet, reduce portion size, continue improving regular exercise - start gym cardio exercise plan 3. Follow-up 6 months annual labs A1c        Other Visit Diagnoses    Needs flu shot       Relevant Orders   Flu Vaccine QUAD 36+ mos IM  (Completed)   Need for hepatitis B booster vaccination       Relevant Orders   Hepatitis B vaccine adult IM (Completed)      No orders of the defined types were placed in this encounter.   Follow up plan: Return in about 6 months (around 05/25/2018) for Annual Physical.  Future labs ordered for 04/23/18  Nobie Putnam, [facility] 11/24/2017, 4:40 PM

## 2018-02-07 LAB — HM MAMMOGRAPHY

## 2018-02-13 ENCOUNTER — Encounter: Payer: Self-pay | Admitting: Family Medicine

## 2018-02-13 ENCOUNTER — Telehealth: Payer: Self-pay

## 2018-02-13 NOTE — Telephone Encounter (Signed)
Abstraction  

## 2018-02-19 LAB — HM MAMMOGRAPHY

## 2018-02-20 ENCOUNTER — Encounter: Payer: Self-pay | Admitting: Family Medicine

## 2018-04-18 ENCOUNTER — Ambulatory Visit (INDEPENDENT_AMBULATORY_CARE_PROVIDER_SITE_OTHER): Payer: BC Managed Care – PPO | Admitting: Family Medicine

## 2018-04-18 ENCOUNTER — Telehealth: Payer: Self-pay

## 2018-04-18 ENCOUNTER — Encounter: Payer: Self-pay | Admitting: Family Medicine

## 2018-04-18 ENCOUNTER — Other Ambulatory Visit: Payer: Self-pay

## 2018-04-18 DIAGNOSIS — R42 Dizziness and giddiness: Secondary | ICD-10-CM | POA: Diagnosis not present

## 2018-04-18 DIAGNOSIS — R5381 Other malaise: Secondary | ICD-10-CM

## 2018-04-18 DIAGNOSIS — Z20828 Contact with and (suspected) exposure to other viral communicable diseases: Secondary | ICD-10-CM

## 2018-04-18 DIAGNOSIS — R059 Cough, unspecified: Secondary | ICD-10-CM

## 2018-04-18 DIAGNOSIS — R197 Diarrhea, unspecified: Secondary | ICD-10-CM

## 2018-04-18 DIAGNOSIS — Z20822 Contact with and (suspected) exposure to covid-19: Secondary | ICD-10-CM

## 2018-04-18 DIAGNOSIS — R112 Nausea with vomiting, unspecified: Secondary | ICD-10-CM | POA: Diagnosis not present

## 2018-04-18 DIAGNOSIS — R05 Cough: Secondary | ICD-10-CM

## 2018-04-18 DIAGNOSIS — R5383 Other fatigue: Secondary | ICD-10-CM

## 2018-04-18 NOTE — Telephone Encounter (Signed)
The pt called with complaints of diarrhea, lack of appetite, mild cough x 5 days. Today she complains of dizziness that started this morning nauseated, regurgitation and fatigue.  She was so dizzy this morning that she fell down in the bathroom w/o any injures. The pt states that she was told that she was expose to several people who tested positive at her church to COVID-19 and Is current under quarantine. She have currently been eating soup and drinking gingerale for several days, but she states she have no desire to eat. She denies SOB, fever chills or chest pain, numbness or tingling. Please advise

## 2018-04-18 NOTE — Progress Notes (Signed)
Virtual Visit via Telephone The purpose of this virtual visit is to provide medical care while limiting exposure to the novel coronavirus (COVID19) for both patient and office staff.  Consent was obtained for phone visit:  Yes.   Answered questions that patient had about telehealth interaction:  Yes.   I discussed the limitations, risks, security and privacy concerns of performing an evaluation and management service by telephone. I also discussed with the patient that there may be a patient responsible charge related to this service. The patient expressed understanding and agreed to proceed.  Patient Location: Home Provider Location: [facility])   ----------------------------------------------------------------------  Chief Complaint  Patient presents with  . Dizziness    diarrhea, lack of appetite, and mild cough x 5 days. Dizziness that started this morning nauseated, vomiting and fatigue.  She states the dizziness was so bad this morning that she fell down in the bathroom w/o any injures. She admit that she was expose to several people who tested positive at her church to COVID-19. She have been currently under quarantine since the exposure. she was told to stay in quarantine until April 5th.    S: Reviewed CMA telephone note below. I have called patient and gathered additional HPI as follows:  Reports that symptoms started about 5 days ago with constellation of viral syndrome symptoms with cough, diarrhea, nausea, vomiting, reduced appetite, fatigue and malaise, seemed symptoms worse in past 24-48 hours. - Tried OTC Delsym - with good results for cough - Admits some body aches that have improved, she took motrin initially, not taking tylenol yet - She has been drinking ginger ale, soup, nectarines and staying hydrated best she can with water, has reduced taste and poor appetite but trying to stay hydrated  Patient currently already at home in isolation /  quarantine since onset symptoms Admits known exposure on 3/22 at church event in [place] - multiple contact with positive tested patients however it was unknown at that time, she found out about 3 days ago about their results. Her symptoms onset 4-5 days after potential exposure Denies any high risk travel to areas of current concern for COVID19.  Her husband is also at home in quarantine, without symptoms  Denies any fevers, chills, sweats, shortness of breath, sinus pain or pressure, headache, abdominal pain  -------------------------------------------------------------------------- O: No physical exam performed due to remote telephone encounter.  -------------------------------------------------------------------------- A&P: Suspected COVID19 vs Viral Syndrome w/ GI constellation symptoms Known exposure, timeline is consistent with possible COVID19 - Reassuring without high risk symptoms - Afebrile, without dyspnea - No comorbid pulmonary conditions (asthma, COPD) or immunocompromise  - Currently patient is HIGH RISK for COVID19 based on current symptoms / known exposure.  1. Remain in home isolation quarantine - see details below 2. No testing required for mild to moderate symptoms - if patient able to stay at home 3. Offered symptomatic rx - Zofran for nausea/vomiting, she declines, offered cough medicine rx she declined - will continue delsym, and advised she can take OTC Imodium PRN for diarrhea, and she should STOP Ibuprofen/motrin and switch to Tylenol PRN due to possible COVID19 4. Improve hydration nutrition as she is 5. Strict return call back criteria reviewed when to go to hospital if severe case  No orders of the defined types were placed in this encounter.   REQUIRED self quarantine to [place] - advised to avoid all exposure with others while during treatment. Should continue to quarantine for up to 7-14 days,  pending resolution of symptoms, if symptoms  resolve by 7 days and is afebrile >3 days - may STOP self quarantine at that time.   If symptoms do not resolve or significantly improve OR if WORSENING - fever / cough - or worsening shortness of breath - then should contact us and seek advice on next steps in treatment at home vs where/when to seek care at Urgent Care or Hospital ED for further intervention and possible testing if indicated.  Patient verbalizes understanding with the above medical recommendations including the limitation of remote medical advice.  Specific follow-up / call-back criteria were given for patient to follow-up or seek medical care more urgently if needed.   - Time spent in direct consultation with patient on phone: 10 minutes  Nobie Putnam, [facility] 04/18/2018, 9:15 AM

## 2018-04-18 NOTE — Patient Instructions (Addendum)
Concern for possible COVID19  Call back if want zofran nausea, or cough medicine  Do not use motrin. Use tylenol instead  REQUIRED self quarantine to [place] - advised to avoid all exposure with others while during treatment. Should continue to quarantine for up to 7-14 days, pending resolution of symptoms, if symptoms resolve by 7 days and is afebrile >3 days - may STOP self quarantine at that time.  Recommend that you closely monitor symptoms, if significant worsening shortness of breath, persistent fever, not improving within 48 hours, then seek care at hospital ED or Health Department for further evaluation and testing.  If you have any other questions or concerns, please feel free to call the office or send a message through [place]. You may also schedule an earlier appointment if necessary.  Additionally, you may be receiving a survey about your experience at our office within a few days to 1 week by e-mail or mail. We value your feedback.  Nobie Putnam, DO [place]

## 2018-04-18 NOTE — Telephone Encounter (Signed)
See virtual visit telephone encounter from today.  Regina Barber, [facility] 04/18/2018, 9:37 AM

## 2018-04-23 ENCOUNTER — Other Ambulatory Visit: Payer: Self-pay

## 2018-04-23 ENCOUNTER — Other Ambulatory Visit: Payer: BC Managed Care – PPO

## 2018-04-24 ENCOUNTER — Encounter: Payer: BC Managed Care – PPO | Admitting: Family Medicine

## 2018-04-27 ENCOUNTER — Encounter: Payer: BC Managed Care – PPO | Admitting: Family Medicine

## 2018-05-06 ENCOUNTER — Other Ambulatory Visit: Payer: Self-pay | Admitting: Family Medicine

## 2018-05-06 DIAGNOSIS — J4520 Mild intermittent asthma, uncomplicated: Secondary | ICD-10-CM

## 2018-05-06 DIAGNOSIS — J3089 Other allergic rhinitis: Secondary | ICD-10-CM

## 2018-05-07 ENCOUNTER — Other Ambulatory Visit: Payer: Self-pay | Admitting: Family Medicine

## 2018-05-07 DIAGNOSIS — I10 Essential (primary) hypertension: Secondary | ICD-10-CM

## 2018-05-15 ENCOUNTER — Telehealth: Payer: Self-pay

## 2018-05-15 NOTE — Telephone Encounter (Signed)
I am fairly certain that this change was requested by her pharmacy due to back order or out of stock Lisinopril. It was only ordered for 1 month. I have no record of this reason unfortunately it does not always save to chart if pharmacy wrote a note on the refill request.  Please call her pharmacy to check on this history if they have a record of why Benazepril was requested. And if her Lisinopril is available - if it is then I would recommend switch back to Lisinopril 20mg  daily.  Nobie Putnam, [facility] 05/15/2018, 12:54 PM

## 2018-05-15 NOTE — Telephone Encounter (Signed)
Patient is questioning about benazepril that was Rx on 04/20 she used to take lisinopril wanted to know why did Dr. Raliegh Ip switched the medication.

## 2018-05-15 NOTE — Telephone Encounter (Signed)
Back order from the pharmacy patient advised also pharmacy will call the patient.

## 2018-06-08 ENCOUNTER — Other Ambulatory Visit: Payer: Self-pay | Admitting: Family Medicine

## 2018-06-08 DIAGNOSIS — I10 Essential (primary) hypertension: Secondary | ICD-10-CM

## 2018-08-09 ENCOUNTER — Telehealth: Payer: Self-pay

## 2018-08-09 NOTE — Telephone Encounter (Signed)
Left message for patient to call back  

## 2018-08-09 NOTE — Telephone Encounter (Signed)
Can fax the paper order [phone] for breast ultrasound if needed and diagnostic mammogram.

## 2018-08-09 NOTE — Telephone Encounter (Addendum)
I am not sure best way to order Mammograms at [facility].  Could you [facility] and have them fax [place] an order form, or we can do verbal orders.  I would like her to proceed with the 6 month diagnostic mammogram.  Nobie Putnam, [facility] 08/09/2018, 1:51 PM

## 2018-08-09 NOTE — Telephone Encounter (Signed)
Patient called needing a order for a 6 month diagnostic mammogram to [facility] .

## 2018-08-10 NOTE — Telephone Encounter (Signed)
Faxed the order.

## 2018-08-10 NOTE — Telephone Encounter (Signed)
Written order for breast ultrasound R and diagnostic mammo  To be faxed  Nobie Putnam, [facility] 08/10/2018, 8:29 AM

## 2018-08-20 ENCOUNTER — Encounter: Payer: Self-pay | Admitting: Family Medicine

## 2018-08-20 LAB — HM MAMMOGRAPHY

## 2018-08-22 ENCOUNTER — Encounter: Payer: Self-pay | Admitting: Family Medicine

## 2018-09-01 ENCOUNTER — Other Ambulatory Visit: Payer: Self-pay | Admitting: Family Medicine

## 2018-09-01 DIAGNOSIS — I10 Essential (primary) hypertension: Secondary | ICD-10-CM

## 2018-11-04 ENCOUNTER — Other Ambulatory Visit: Payer: Self-pay | Admitting: Family Medicine

## 2018-11-04 DIAGNOSIS — I10 Essential (primary) hypertension: Secondary | ICD-10-CM

## 2019-01-08 ENCOUNTER — Other Ambulatory Visit: Payer: Self-pay | Admitting: Family Medicine

## 2019-01-08 DIAGNOSIS — J4520 Mild intermittent asthma, uncomplicated: Secondary | ICD-10-CM

## 2019-01-08 DIAGNOSIS — J3089 Other allergic rhinitis: Secondary | ICD-10-CM

## 2019-02-04 ENCOUNTER — Other Ambulatory Visit: Payer: Self-pay | Admitting: Family Medicine

## 2019-02-04 DIAGNOSIS — I10 Essential (primary) hypertension: Secondary | ICD-10-CM

## 2019-02-04 DIAGNOSIS — J4520 Mild intermittent asthma, uncomplicated: Secondary | ICD-10-CM

## 2019-02-04 MED ORDER — ALBUTEROL SULFATE HFA 108 (90 BASE) MCG/ACT IN AERS: 1.0000 | INHALATION_SPRAY | RESPIRATORY_TRACT | 3 refills | Status: DC | PRN

## 2019-02-04 NOTE — Telephone Encounter (Signed)
Pt. called requesting refill for Proventil

## 2019-03-06 ENCOUNTER — Other Ambulatory Visit: Payer: Self-pay

## 2019-03-06 ENCOUNTER — Encounter: Payer: Self-pay | Admitting: Family Medicine

## 2019-03-06 ENCOUNTER — Ambulatory Visit (INDEPENDENT_AMBULATORY_CARE_PROVIDER_SITE_OTHER): Payer: BC Managed Care – PPO | Admitting: Family Medicine

## 2019-03-06 DIAGNOSIS — R42 Dizziness and giddiness: Secondary | ICD-10-CM | POA: Diagnosis not present

## 2019-03-06 NOTE — Patient Instructions (Addendum)
Stay well hydrated. Make sure to eat regular, don't skip meals. Caution sudden standing.  If keeps bothering you may need to do referral to ENT.  1. You have symptoms of Vertigo (Benign Paroxysmal Positional Vertigo) - This is commonly caused by inner ear fluid imbalance, sometimes can be worsened by allergies and sinus symptoms, otherwise it can occur randomly sometimes and we may never discover the exact cause. - To treat this, try the Epley Manuever (see diagrams/instructions below) at home up to 3 times a day for 1-2 weeks or until symptoms resolve - You may take Meclizine as needed up to 3 times a day for dizziness, this will not cure symptoms but may help. Caution may make you drowsy.  If you develop significant worsening episode with vertigo that does not improve and you get severe headache, loss of vision, arm or leg weakness, slurred speech, or other concerning symptoms please seek immediate medical attention at Emergency Department.   See the next page for images describing the Epley Manuever.     ----------------------------------------------------------------------------------------------------------------------          Please schedule a Follow-up Appointment to: Return in about 2 weeks (around 03/20/2019), or if symptoms worsen or fail to improve, for dizziness.  If you have any other questions or concerns, please feel free to call the office or send a message through [place]. You may also schedule an earlier appointment if necessary.  Additionally, you may be receiving a survey about your experience at our office within a few days to 1 week by e-mail or mail. We value your feedback.  Nobie Putnam, DO [place]

## 2019-03-06 NOTE — Progress Notes (Signed)
Virtual Visit via Telephone The purpose of this virtual visit is to provide medical care while limiting exposure to the novel coronavirus (COVID19) for both patient and office staff.  Consent was obtained for phone visit:  Yes.   Answered questions that patient had about telehealth interaction:  Yes.   I discussed the limitations, risks, security and privacy concerns of performing an evaluation and management service by telephone. I also discussed with the patient that there may be a patient responsible charge related to this service. The patient expressed understanding and agreed to proceed.  Patient Location: Home Provider Location: [facility])  ---------------------------------------------------------------------- Chief Complaint  Patient presents with  . Dizziness    pt state she had two episodes that came over her why she was sitting once on [place] x 2 weeks ago. Another episode on this past Monday..  Her vison got blurry and she felt a little dizzy. The episodes last for seconds and subsided      S: Reviewed CMA documentation. I have called patient and gathered additional HPI as follows:  DIZZINESS EPISODES, Intermittent Reports that symptoms started 2 weeks ago. First episode 2 weeks ago on Saturday. 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 Then she was fine and did not have any residual effects. Then she did well until this past Monday and had another acute episode similar, she was also sitting and not exertional related, said similar sudden dizziness episode with blurry vision again and then it resolved. She thinks the second one may have been quicker or shorter than the first one. - She is unsure exact trigger. She does admit only thing she had on those episodes was coffee, limited intake PO prior, and said she was very busy. - She has known inner ear issue  and fluid behind ear, some reduced hearing. She has not seen ENT.  Denies any high risk travel to areas of current concern for COVID19. Denies any known or suspected exposure to person with or possibly with COVID19.  Denies any fevers, chills, sweats, body ache, cough, shortness of breath, sinus pain or pressure, headache, abdominal pain, diarrhea  Past Medical History:  Diagnosis Date  . Allergy   . Asthma   . Genital warts   . Hypertension    Social History   Tobacco Use  . Smoking status: Former Smoker    Packs/day: 1.00    Years: 30.00    Pack years: 30.00    Types: Cigarettes    Quit date: 2008    Years since quitting: 13.1  . Smokeless tobacco: Never Used  . Tobacco comment: Quit on own after birth of daughter  Substance Use Topics  . Alcohol use: No  . Drug use: No    Comment: Former history of substance use    Current Outpatient Medications:  .  albuterol (VENTOLIN HFA) 108 (90 Base) MCG/ACT inhaler, Inhale 1-2 puffs into the lungs every 4 (four) hours as needed for wheezing or shortness of breath., Disp: 6.7 g, Rfl: 3 .  aspirin EC 81 MG tablet, Take 81 mg by mouth daily., Disp: , Rfl:  .  benazepril (LOTENSIN) 10 MG tablet, TAKE 2 TABLETS BY MOUTH EVERY DAY, Disp: 180 tablet, Rfl: 1 .  cetirizine (ZYRTEC) 10 MG tablet, Take 1 tablet (10 mg total) by mouth daily., Disp: 30 tablet, Rfl: 0 .  hydrochlorothiazide (HYDRODIURIL) 25 MG tablet, TAKE 1  TABLET (25 MG TOTAL) BY MOUTH DAILY., Disp: 90 tablet, Rfl: 3 .  montelukast (SINGULAIR) 10 MG tablet, TAKE 1 TABLET BY MOUTH EVERYDAY AT BEDTIME, Disp: 90 tablet, Rfl: 1 .  ibuprofen (ADVIL,MOTRIN) 600 MG tablet, TAKE 1 TABLET (600 MG TOTAL) BY MOUTH EVERY 8 (EIGHT) HOURS AS NEEDED. WITH FOOD (Patient not taking: Reported on 04/18/2018), Disp: 30 tablet, Rfl: 0  Depression screen [facility] 2/9 08/23/2017 05/19/2017 11/08/2016  Decreased Interest 0 0 0  Down, Depressed, Hopeless 0 0 0  PHQ - 2 Score 0 0 0    No flowsheet data  found.  -------------------------------------------------------------------------- O: No physical exam performed due to remote telephone encounter.  Lab results reviewed.  No results found for this or any previous visit (from the past 2160 hour(s)).  -------------------------------------------------------------------------- A&P:  Problem List Items Addressed This Visit    None    Visit Diagnoses    Episode of dizziness    -  Primary     Uncertain exact cause Possible inner ear dysequilibrium, not entirely consistent with a persistent vertigo Cannot rule out nutrition or hydration related, seems days it triggered she didn't take in as much nutrition or hydration. Seems improving or resolving now. No persistent symptoms. No focal neuro deficits or other concerning features based on reported history.  Reassurance today Monitor for further episodes Improve hydration, nutrition Caution with skipping meals, avoid low blood sugar. Try home Epley Maneuver if needed Follow-up if returns and consider ENT referral  No orders of the defined types were placed in this encounter.   Follow-up: - Return as needed  Patient verbalizes understanding with the above medical recommendations including the limitation of remote medical advice.  Specific follow-up and call-back criteria were given for patient to follow-up or seek medical care more urgently if needed.   - Time spent in direct consultation with patient on phone: 8 minutes   Nobie Putnam, [facility] 03/06/2019, 2:34 PM

## 2019-03-15 ENCOUNTER — Ambulatory Visit (INDEPENDENT_AMBULATORY_CARE_PROVIDER_SITE_OTHER): Payer: BC Managed Care – PPO | Admitting: Family Medicine

## 2019-03-15 ENCOUNTER — Encounter: Payer: Self-pay | Admitting: Family Medicine

## 2019-03-15 ENCOUNTER — Other Ambulatory Visit: Payer: Self-pay

## 2019-03-15 DIAGNOSIS — H9202 Otalgia, left ear: Secondary | ICD-10-CM | POA: Diagnosis not present

## 2019-03-15 DIAGNOSIS — H6983 Other specified disorders of Eustachian tube, bilateral: Secondary | ICD-10-CM | POA: Diagnosis not present

## 2019-03-15 NOTE — Progress Notes (Signed)
Virtual Visit via Telephone The purpose of this virtual visit is to provide medical care while limiting exposure to the novel coronavirus (COVID19) for both patient and office staff.  Consent was obtained for phone visit:  Yes.   Answered questions that patient had about telehealth interaction:  Yes.   I discussed the limitations, risks, security and privacy concerns of performing an evaluation and management service by telephone. I also discussed with the patient that there may be a patient responsible charge related to this service. The patient expressed understanding and agreed to proceed.  Patient Location: Home Provider Location: [facility])  ---------------------------------------------------------------------- Chief Complaint  Patient presents with  . Ear Pain    onset couple of days left side worst since yesterday    S: Reviewed CMA documentation. I have called patient and gathered additional HPI as follows:  Left > R Ear Pain / Eustachian tube dysfunction - Last visit with me 03/06/19, for initial visit for similar problem, sinus pressure and inner ear issue vertigo, treated with home epley manuever, see prior notes for background information. - Interval update with improved dizziness vertigo - Today patient reports now recent onset past few days L ear pain and pressure, and now seems to be improved today, yesterday was worst but now better - taking allergy med, Zyrtec in AM and Singulair in PM - She cannot use nasal sprays Denies hearing loss, ear drainage  Denies any high risk travel to areas of current concern for COVID19. Denies any known or suspected exposure to person with or possibly with COVID19.  Denies any fevers, chills, sweats, body ache, cough, shortness of breath,  abdominal pain, diarrhea  Past Medical History:  Diagnosis Date  . Allergy   . Asthma   . Genital warts   . Hypertension    Social History   Tobacco Use  . Smoking  status: Former Smoker    Packs/day: 1.00    Years: 30.00    Pack years: 30.00    Types: Cigarettes    Quit date: 2008    Years since quitting: 13.1  . Smokeless tobacco: Never Used  . Tobacco comment: Quit on own after birth of daughter  Substance Use Topics  . Alcohol use: No  . Drug use: No    Comment: Former history of substance use    Current Outpatient Medications:  .  albuterol (VENTOLIN HFA) 108 (90 Base) MCG/ACT inhaler, Inhale 1-2 puffs into the lungs every 4 (four) hours as needed for wheezing or shortness of breath., Disp: 6.7 g, Rfl: 3 .  aspirin EC 81 MG tablet, Take 81 mg by mouth daily., Disp: , Rfl:  .  benazepril (LOTENSIN) 10 MG tablet, TAKE 2 TABLETS BY MOUTH EVERY DAY, Disp: 180 tablet, Rfl: 1 .  cetirizine (ZYRTEC) 10 MG tablet, Take 1 tablet (10 mg total) by mouth daily., Disp: 30 tablet, Rfl: 0 .  hydrochlorothiazide (HYDRODIURIL) 25 MG tablet, TAKE 1 TABLET (25 MG TOTAL) BY MOUTH DAILY., Disp: 90 tablet, Rfl: 3 .  ibuprofen (ADVIL,MOTRIN) 600 MG tablet, TAKE 1 TABLET (600 MG TOTAL) BY MOUTH EVERY 8 (EIGHT) HOURS AS NEEDED. WITH FOOD, Disp: 30 tablet, Rfl: 0 .  montelukast (SINGULAIR) 10 MG tablet, TAKE 1 TABLET BY MOUTH EVERYDAY AT BEDTIME, Disp: 90 tablet, Rfl: 1  Depression screen [facility] 2/9 03/15/2019 08/23/2017 05/19/2017  Decreased Interest 0 0 0  Down, Depressed, Hopeless 0 0 0  PHQ - 2 Score 0 0 0    No flowsheet  data found.  -------------------------------------------------------------------------- O: No physical exam performed due to remote telephone encounter.  Lab results reviewed.  No results found for this or any previous visit (from the past 2160 hour(s)).  -------------------------------------------------------------------------- A&P:  Problem List Items Addressed This Visit    None    Visit Diagnoses    Dysfunction of both eustachian tubes    -  Primary   Left ear pain         Acute on chronic L > R bilateral eustachian tube  dysfunction without hearing loss or complication Known allergies No history of infection, seems improving now Cannot use nasal spray On allergy therapy Continue current regimen May add OTC decongestant Future offered Steroid prednisone short course or antibiotic if worsening criteria met, maybe ENT in future  Follow-up if not improved or worsening, return criteria   No orders of the defined types were placed in this encounter.   Follow-up: - Return in 1-2 week as needed eustachian tube dysfunction  Patient verbalizes understanding with the above medical recommendations including the limitation of remote medical advice.  Specific follow-up and call-back criteria were given for patient to follow-up or seek medical care more urgently if needed.   - Time spent in direct consultation with patient on phone: 7 minutes   Nobie Putnam, [facility] 03/15/2019, 4:16 PM

## 2019-03-15 NOTE — Patient Instructions (Addendum)
You have some Eustachian Tube Dysfunction, this problem is usually caused by some deeper sinus swelling and pressure, causing difficulty of eustachian tubes to clear fluid from behind ear drum. You can have ear pain, pressure, fullness, loss of hearing. Often related to sinus symptoms and sometimes with sinusitis or infection or allergy symptoms.  Treatment: - Keep using OTC allergy medicine (Claritin, Zyrtec, or Allegra - or generics) once daily - For significant ear/sinus pressure, try OTC decongestant such as Phenylephrine or similar medicines, included in DayQuil, take this for up to 1 week or less, no longer  If any significant worsening, loss of hearing, constant pain, fever/chills, or concern for infection - notify office and we can send in an antibiotic. Or if just persistent pressure that is not improving, you may contact me back within 1 week and we can consider a brief course of oral steroid prednisone for 3 days only to help reduce swelling, as discussed this is not ideal treatment and can cause side effects.  If not improving we can consider steroid, antibiotic, or referral to ENT   Please schedule a Follow-up Appointment to: Return in about 1 week (around 03/22/2019), or if symptoms worsen or fail to improve, for ear pain estuachian tubes.  If you have any other questions or concerns, please feel free to call the office or send a message through [place]. You may also schedule an earlier appointment if necessary.  Additionally, you may be receiving a survey about your experience at our office within a few days to 1 week by e-mail or mail. We value your feedback.  Nobie Putnam, DO [place]

## 2019-03-19 ENCOUNTER — Encounter: Payer: Self-pay | Admitting: Family Medicine

## 2019-03-19 NOTE — Progress Notes (Signed)
Received fax that patient is due for repeat diagnostic mammo at [facility]. [facility]  Last mammo 08/2018 showed bi-rads 3, R breast nodule, thought to be benign, repeat in 6 months.  Due for Diagnostic Bilateral Mammogram, R breast ultrasound.  Order form completed. To be faxed.  Nobie Putnam, DO [facility] 03/19/2019, 2:13 PM

## 2019-03-27 LAB — HM MAMMOGRAPHY

## 2019-03-28 ENCOUNTER — Encounter: Payer: Self-pay | Admitting: Family Medicine

## 2019-05-10 ENCOUNTER — Telehealth: Payer: Self-pay

## 2019-05-10 DIAGNOSIS — M26621 Arthralgia of right temporomandibular joint: Secondary | ICD-10-CM

## 2019-05-10 MED ORDER — IBUPROFEN 600 MG PO TABS: 600.0000 mg | ORAL_TABLET | Freq: Three times a day (TID) | ORAL | 2 refills | Status: DC | PRN

## 2019-05-10 NOTE — Telephone Encounter (Signed)
Sent rx ibuprofen 600mg  PRN  Nobie Putnam, DO [facility] 05/10/2019, 3:54 PM

## 2019-05-10 NOTE — Telephone Encounter (Signed)
Copied from [place] [id number]. Topic: General - Inquiry >> May 10, 2019 12:19 PM Mathis Bud wrote: Reason for CRM: Patient is requesting some ibuprofen for her ear pain, patient states that it is now in jaw.  Patient has seen PCP regarding this back in feb.  Call back [phone]  [id number] - [place], [address]  Phone:  [phone] Fax:  [phone]

## 2019-07-07 ENCOUNTER — Other Ambulatory Visit: Payer: Self-pay | Admitting: Family Medicine

## 2019-07-07 DIAGNOSIS — J4520 Mild intermittent asthma, uncomplicated: Secondary | ICD-10-CM

## 2019-07-07 DIAGNOSIS — J3089 Other allergic rhinitis: Secondary | ICD-10-CM

## 2019-07-07 NOTE — Telephone Encounter (Signed)
Requested Prescriptions  Pending Prescriptions Disp Refills  . montelukast (SINGULAIR) 10 MG tablet [Pharmacy Med Name: MONTELUKAST SOD 10 MG TABLET] 90 tablet 1    Sig: TAKE 1 TABLET BY MOUTH EVERYDAY AT BEDTIME     Pulmonology:  Leukotriene Inhibitors Passed - 07/07/2019  9:33 AM      Passed - Valid encounter within last 12 months    Recent Outpatient Visits          3 months ago Dysfunction of both eustachian tubes   [place], DO   4 months ago Episode of dizziness   [place], DO   1 year ago Cough   [place], DO   1 year ago Pre-diabetes   [place], DO   1 year ago Localized swelling, mass and lump, neck   [place], Devonne Doughty, [place]

## 2019-07-10 ENCOUNTER — Other Ambulatory Visit: Payer: Self-pay

## 2019-07-10 ENCOUNTER — Telehealth (INDEPENDENT_AMBULATORY_CARE_PROVIDER_SITE_OTHER): Payer: BC Managed Care – PPO | Admitting: Family Medicine

## 2019-07-10 ENCOUNTER — Encounter: Payer: Self-pay | Admitting: Family Medicine

## 2019-07-10 DIAGNOSIS — J0110 Acute frontal sinusitis, unspecified: Secondary | ICD-10-CM

## 2019-07-10 NOTE — Progress Notes (Signed)
Subjective:    Patient ID: Regina Barber, female    DOB: [birth year], 59 y.o.   MRN: [id number]  Regina Barber is a 59 y.o. female presenting on 07/10/2019 for Sore Throat (onset 5 days, yellowish mucus, fever, vomit has both covid vaccine denies HA or ear pain or chills --OTC not helping with her cough)  Virtual / Telehealth Encounter - Video Visit via MyChart The purpose of this virtual visit is to provide medical care while limiting exposure to the novel coronavirus (COVID19) for both patient and office staff.  Consent was obtained for remote visit:  Yes.   Answered questions that patient had about telehealth interaction:  Yes.   I discussed the limitations, risks, security and privacy concerns of performing an evaluation and management service by video/telephone. I also discussed with the patient that there may be a patient responsible charge related to this service. The patient expressed understanding and agreed to proceed.  Patient Location: Home Provider Location: [facility] (Office)   HPI  SINUSITIS / URI Reports onset symptoms over past weekend with sinus pressure and congestion, with progression to coughing and productive yellow phlegm and a sore throat as well - She was out of work Monday and Tuesday. She was advised to stay out of work due to had temp elevated at 100.87F. Other temperature reported at 100.38F - taking some Delsym cough syrup as needed. Not taking Tylenol. - Admits nausea vomiting episode, no further episodes Denies any dyspnea, diarrhea  Health Maintenance: UTD COVID19 moderna vaccine.  Depression screen [facility] 2/9 03/15/2019 08/23/2017 05/19/2017  Decreased Interest 0 0 0  Down, Depressed, Hopeless 0 0 0  PHQ - 2 Score 0 0 0    Social History   Tobacco Use  . Smoking status: Former Smoker    Packs/day: 1.00    Years: 30.00    Pack years: 30.00    Types: Cigarettes    Quit date: 2008    Years since quitting: 13.4  . Smokeless  tobacco: Never Used  . Tobacco comment: Quit on own after birth of daughter  Vaping Use  . Vaping Use: Never used  Substance Use Topics  . Alcohol use: No  . Drug use: No    Comment: Former history of substance use    Review of Systems Per HPI unless specifically indicated above     Objective:    There were no vitals taken for this visit.  Wt Readings from Last 3 Encounters:  11/24/17 130 lb (59 kg)  11/13/17 124 lb (56.2 kg)  08/23/17 125 lb (56.7 kg)    Physical Exam   Note examination was completely remotely via video observation objective data only  Gen - well-appearing, no acute distress or apparent pain, comfortable HEENT - eyes appear clear without discharge or redness Heart/Lungs - cannot examine virtually - observed no evidence of coughing or labored breathing. Abd - cannot examine virtually  Skin - face visible today- no rash Neuro - awake, alert, oriented Psych - not anxious appearing   Results for orders placed or performed in visit on 03/28/19  [facility] MAMMOGRAPHY  Result Value Ref Range   HM Mammogram 0-4 Bi-Rad 0-4 Bi-Rad, Self Reported Normal      Assessment & Plan:   Problem List Items Addressed This Visit    None    Visit Diagnoses    Acute non-recurrent frontal sinusitis    -  Primary      No orders of the defined types were  placed in this encounter.  Consistent with acute frontal rhinosinusitis, likely initially viral URI vs allergic rhinitis component No obvious evidence of bacterial infection at this time S/p COVID19 vaccine  Plan: 1. Reassurance, likely self-limited - no indication for antibiotics at this time 2. Continue Delsym OTC cough/cold medications 3. Supportive care with nasal saline OTC, hydration 4. Return criteria reviewed  Work note written, given low grade fever, may return Fri, if not improved or has fever can contact us back, consider antibiotic if indicated.   Follow up plan: Return in about 2 days (around 07/12/2019),  or if symptoms worsen or fail to improve, for URI.  Patient verbalizes understanding with the above medical recommendations including the limitation of remote medical advice.  Specific follow-up and call-back criteria were given for patient to follow-up or seek medical care more urgently if needed.  Total duration of direct patient care provided via video conference: 15 minutes   Nobie Putnam, [facility] 07/10/2019, 11:47 AM

## 2019-07-10 NOTE — Patient Instructions (Addendum)
Keep on current plan, Delsym and fluids and continue therapy at home.  No antibiotics today  Holding off on other cough medicines for now.  Work note written. Goal to return on Friday, if not improved or have fever can return and call us back we can order antibiotic and extend your work note.   Please schedule a Follow-up Appointment to: Return in about 2 days (around 07/12/2019), or if symptoms worsen or fail to improve, for URI.  If you have any other questions or concerns, please feel free to call the office or send a message through [place]. You may also schedule an earlier appointment if necessary.  Additionally, you may be receiving a survey about your experience at our office within a few days to 1 week by e-mail or mail. We value your feedback.  Nobie Putnam, DO [place]

## 2019-07-11 ENCOUNTER — Telehealth: Payer: Self-pay

## 2019-07-11 DIAGNOSIS — J0110 Acute frontal sinusitis, unspecified: Secondary | ICD-10-CM

## 2019-07-11 NOTE — Telephone Encounter (Signed)
Copied from [place] [id number]. Topic: General - Other >> Jul 11, 2019  1:34 PM Hinda Lenis D wrote: Reason for CRM: PT need a work note / still with fever

## 2019-07-11 NOTE — Telephone Encounter (Signed)
New work note sent to her [place]. Added Friday. She can return Monday if no fever.  She should follow-up with [place] by phone or mychart if she needs antibiotic or other treatment.  If worse over weekend, I would recommend Urgent Care as a possibility.  Nobie Putnam, [facility] 07/11/2019, 5:32 PM

## 2019-07-12 NOTE — Telephone Encounter (Signed)
Left detail message for the patient

## 2019-07-15 MED ORDER — AMOXICILLIN-POT CLAVULANATE 875-125 MG PO TABS: 1.0000 | ORAL_TABLET | Freq: Two times a day (BID) | ORAL | 0 refills | Status: DC

## 2019-07-15 NOTE — Telephone Encounter (Signed)
Spoke to the patient still has lingering cough with yellowish mucus but no fever wanted [facility] ?

## 2019-07-15 NOTE — Telephone Encounter (Signed)
Pt states that her fever is gone and she went back to work today but she is having coughing spells and is coughing up and having a yellow discharge. Pt states that she was to call if she thought she needed an antibiotic. She would like dr Raliegh Ip to FU with that.  FU # [phone]

## 2019-07-15 NOTE — Telephone Encounter (Signed)
I called patient. Sent rx Augmentin as we had discussed during visit last week. No further work note needed at this time.  Nobie Putnam, [facility] 07/15/2019, 5:43 PM

## 2019-07-15 NOTE — Addendum Note (Signed)
Addended by: Olin Hauser on: 07/15/2019 05:43 PM   Modules accepted: Orders

## 2019-09-25 DIAGNOSIS — N63 Unspecified lump in unspecified breast: Secondary | ICD-10-CM

## 2019-09-25 NOTE — Telephone Encounter (Signed)
Patient will need Mammogram orders at [facility], on [address], [place])  Ph: [phone] Fax: [phone]  Last time we ordered it in 03/2019, I believe you contacted them and they sent [place] a fax order form.  Last mammogram was Birads category 3. 11 o clock nodule.  She will need:  RIGHT ONLY - Diagnostic 2D/3D Mammogram and RIGHT Ultrasound.  Diagnosis code N63.0  Nobie Putnam, DO [facility] 09/25/2019, 5:49 PM

## 2019-09-26 ENCOUNTER — Other Ambulatory Visit: Payer: Self-pay | Admitting: Family Medicine

## 2019-10-03 ENCOUNTER — Other Ambulatory Visit: Payer: Self-pay | Admitting: Family Medicine

## 2019-10-03 DIAGNOSIS — I10 Essential (primary) hypertension: Secondary | ICD-10-CM

## 2019-10-03 NOTE — Telephone Encounter (Signed)
Requested  medications are  due for refill today yes  Requested medications are on the active medication list yes  Last refill 07/08/19  Last visit more than a year ago  Future visit scheduled no  Notes to clinic Failed protocol of valid visit within 6 months, please assess.

## 2019-10-10 LAB — HM MAMMOGRAPHY

## 2019-10-14 ENCOUNTER — Encounter: Payer: Self-pay | Admitting: Family Medicine

## 2019-11-06 ENCOUNTER — Other Ambulatory Visit: Payer: Self-pay | Admitting: Family Medicine

## 2019-11-06 DIAGNOSIS — I10 Essential (primary) hypertension: Secondary | ICD-10-CM

## 2019-11-06 NOTE — Telephone Encounter (Signed)
Requested medications are due for refill today?  Yes  Requested medications are on active medication list?  Yes  Last Refill:   11/04/2018  # 90 with 3 refills   Future visit scheduled?  No   Notes to Clinic:  Medication failed Rx refill protocol due to no labs within the past 360 days.  Last labs were performed on 05/15/2017.

## 2019-12-01 ENCOUNTER — Other Ambulatory Visit: Payer: Self-pay | Admitting: Family Medicine

## 2019-12-01 DIAGNOSIS — I10 Essential (primary) hypertension: Secondary | ICD-10-CM

## 2019-12-01 NOTE — Telephone Encounter (Signed)
Requested medication (s) are due for refill today: yes  Requested medication (s) are on the active medication list: yes  Last refill:  11/06/19   Future visit scheduled: no  Notes to clinic:  30 day courtesy refill previously given and needs appt  Called pt and LM on VM to call office to schedule appt   Requested Prescriptions  Pending Prescriptions Disp Refills   hydrochlorothiazide (HYDRODIURIL) 25 MG tablet [Pharmacy Med Name: HYDROCHLOROTHIAZIDE 25 MG TAB] 30 tablet 0    Sig: TAKE 1 TABLET BY MOUTH EVERY DAY      Cardiovascular: Diuretics - Thiazide Failed - 12/01/2019  2:30 PM      Failed - Ca in normal range and within 360 days    Calcium  Date Value Ref Range Status  05/15/2017 10.2 8.6 - 10.4 mg/dL Final  04/11/2015 9.9 mg/dL Final          Failed - Cr in normal range and within 360 days    Creat  Date Value Ref Range Status  05/15/2017 0.84 0.50 - 1.05 mg/dL Final    Comment:    For patients >75 years of age, the reference limit for Creatinine is approximately 13% higher for people identified as African-American. .           Failed - K in normal range and within 360 days    Potassium  Date Value Ref Range Status  05/15/2017 4.0 3.5 - 5.3 mmol/L Final  04/11/2015 4.3 mmol/L Final          Failed - Na in normal range and within 360 days    Sodium  Date Value Ref Range Status  05/15/2017 139 135 - 146 mmol/L Final  04/11/2015 143  Final          Passed - Last BP in normal range    BP Readings from Last 1 Encounters:  11/24/17 122/64          Passed - Valid encounter within last 6 months    Recent Outpatient Visits           4 months ago Acute non-recurrent frontal sinusitis   [place], DO   8 months ago Dysfunction of both eustachian tubes   [place], DO   9 months ago Episode of dizziness   [place], DO   1  year ago Cough   [place], Devonne Doughty, DO   2 years ago [place], Devonne Doughty, [place]

## 2019-12-03 ENCOUNTER — Other Ambulatory Visit: Payer: BC Managed Care – PPO

## 2019-12-10 ENCOUNTER — Other Ambulatory Visit: Payer: BC Managed Care – PPO

## 2019-12-10 ENCOUNTER — Telehealth: Payer: Self-pay | Admitting: Family Medicine

## 2019-12-10 ENCOUNTER — Other Ambulatory Visit: Payer: Self-pay

## 2019-12-10 DIAGNOSIS — R7303 Prediabetes: Secondary | ICD-10-CM

## 2019-12-10 DIAGNOSIS — E7800 Pure hypercholesterolemia, unspecified: Secondary | ICD-10-CM

## 2019-12-10 DIAGNOSIS — I10 Essential (primary) hypertension: Secondary | ICD-10-CM

## 2019-12-10 DIAGNOSIS — Z0000 Encounter for general adult medical examination without abnormal findings: Secondary | ICD-10-CM

## 2019-12-10 NOTE — Telephone Encounter (Signed)
Orders signed.

## 2019-12-11 ENCOUNTER — Other Ambulatory Visit: Payer: BC Managed Care – PPO

## 2019-12-26 ENCOUNTER — Other Ambulatory Visit: Payer: BC Managed Care – PPO

## 2019-12-26 ENCOUNTER — Encounter: Payer: BC Managed Care – PPO | Admitting: Family Medicine

## 2019-12-27 ENCOUNTER — Other Ambulatory Visit: Payer: Self-pay | Admitting: *Deleted

## 2019-12-27 ENCOUNTER — Other Ambulatory Visit: Payer: Self-pay | Admitting: Family Medicine

## 2019-12-27 ENCOUNTER — Other Ambulatory Visit: Payer: BC Managed Care – PPO

## 2019-12-27 DIAGNOSIS — R7303 Prediabetes: Secondary | ICD-10-CM

## 2019-12-27 DIAGNOSIS — I10 Essential (primary) hypertension: Secondary | ICD-10-CM

## 2019-12-27 DIAGNOSIS — E7800 Pure hypercholesterolemia, unspecified: Secondary | ICD-10-CM

## 2019-12-27 DIAGNOSIS — Z0000 Encounter for general adult medical examination without abnormal findings: Secondary | ICD-10-CM

## 2019-12-30 ENCOUNTER — Other Ambulatory Visit: Payer: BC Managed Care – PPO

## 2019-12-30 ENCOUNTER — Other Ambulatory Visit: Payer: Self-pay

## 2019-12-31 ENCOUNTER — Other Ambulatory Visit: Payer: Self-pay | Admitting: Family Medicine

## 2019-12-31 ENCOUNTER — Telehealth: Payer: Self-pay | Admitting: Family Medicine

## 2019-12-31 ENCOUNTER — Ambulatory Visit (INDEPENDENT_AMBULATORY_CARE_PROVIDER_SITE_OTHER): Payer: BC Managed Care – PPO | Admitting: Family Medicine

## 2019-12-31 ENCOUNTER — Encounter: Payer: Self-pay | Admitting: Family Medicine

## 2019-12-31 VITALS — BP 116/86 | HR 91 | Temp 97.1°F | Ht 62.0 in | Wt 134.6 lb

## 2019-12-31 DIAGNOSIS — J3089 Other allergic rhinitis: Secondary | ICD-10-CM

## 2019-12-31 DIAGNOSIS — I10 Essential (primary) hypertension: Secondary | ICD-10-CM

## 2019-12-31 DIAGNOSIS — M8949 Other hypertrophic osteoarthropathy, multiple sites: Secondary | ICD-10-CM

## 2019-12-31 DIAGNOSIS — Z0000 Encounter for general adult medical examination without abnormal findings: Secondary | ICD-10-CM | POA: Diagnosis not present

## 2019-12-31 DIAGNOSIS — R42 Dizziness and giddiness: Secondary | ICD-10-CM

## 2019-12-31 DIAGNOSIS — E7800 Pure hypercholesterolemia, unspecified: Secondary | ICD-10-CM

## 2019-12-31 DIAGNOSIS — R221 Localized swelling, mass and lump, neck: Secondary | ICD-10-CM

## 2019-12-31 DIAGNOSIS — M159 Polyosteoarthritis, unspecified: Secondary | ICD-10-CM

## 2019-12-31 DIAGNOSIS — H9202 Otalgia, left ear: Secondary | ICD-10-CM

## 2019-12-31 DIAGNOSIS — R7303 Prediabetes: Secondary | ICD-10-CM

## 2019-12-31 DIAGNOSIS — J4520 Mild intermittent asthma, uncomplicated: Secondary | ICD-10-CM

## 2019-12-31 LAB — COMPLETE METABOLIC PANEL WITH GFR
AG Ratio: 1.5 (calc) (ref 1.0–2.5)
ALT: 16 U/L (ref 6–29)
AST: 15 U/L (ref 10–35)
Albumin: 4.4 g/dL (ref 3.6–5.1)
Alkaline phosphatase (APISO): 68 U/L (ref 37–153)
BUN: 20 mg/dL (ref 7–25)
CO2: 29 mmol/L (ref 20–32)
Calcium: 9.8 mg/dL (ref 8.6–10.4)
Chloride: 104 mmol/L (ref 98–110)
Creat: 1.04 mg/dL (ref 0.50–1.05)
GFR, Est African American: 68 mL/min/{1.73_m2} (ref >=60)
GFR, Est Non African American: 59 mL/min/{1.73_m2} — ABNORMAL LOW (ref >=60)
Globulin: 3 g/dL (calc) (ref 1.9–3.7)
Glucose, Bld: 86 mg/dL (ref 65–99)
Potassium: 3.7 mmol/L (ref 3.5–5.3)
Sodium: 141 mmol/L (ref 135–146)
Total Bilirubin: 0.4 mg/dL (ref 0.2–1.2)
Total Protein: 7.4 g/dL (ref 6.1–8.1)

## 2019-12-31 LAB — CBC WITH DIFFERENTIAL/PLATELET
Absolute Monocytes: 416 {cells}/uL (ref 200–950)
Basophils Absolute: 22 {cells}/uL (ref 0–200)
Basophils Relative: 0.4 %
Eosinophils Absolute: 130 {cells}/uL (ref 15–500)
Eosinophils Relative: 2.4 %
HCT: 39.6 % (ref 35.0–45.0)
Hemoglobin: 13.2 g/dL (ref 11.7–15.5)
Lymphs Abs: 1858 {cells}/uL (ref 850–3900)
MCH: 27.6 pg (ref 27.0–33.0)
MCHC: 33.3 g/dL (ref 32.0–36.0)
MCV: 82.8 fL (ref 80.0–100.0)
MPV: 11.5 fL (ref 7.5–12.5)
Monocytes Relative: 7.7 %
Neutro Abs: 2975 {cells}/uL (ref 1500–7800)
Neutrophils Relative %: 55.1 %
Platelets: 273 Thousand/uL (ref 140–400)
RBC: 4.78 Million/uL (ref 3.80–5.10)
RDW: 14 % (ref 11.0–15.0)
Total Lymphocyte: 34.4 %
WBC: 5.4 Thousand/uL (ref 3.8–10.8)

## 2019-12-31 LAB — LIPID PANEL
Cholesterol: 173 mg/dL (ref <200)
HDL: 44 mg/dL — ABNORMAL LOW (ref >=50)
LDL Cholesterol (Calc): 115 mg/dL (calc) — ABNORMAL HIGH
Non-HDL Cholesterol (Calc): 129 mg/dL (calc) (ref <130)
Total CHOL/HDL Ratio: 3.9 (calc) (ref <5.0)
Triglycerides: 59 mg/dL (ref <150)

## 2019-12-31 LAB — TSH: TSH: 1.03 m[IU]/L (ref 0.40–4.50)

## 2019-12-31 LAB — HEMOGLOBIN A1C
Hgb A1c MFr Bld: 6.1 %{Hb} — ABNORMAL HIGH (ref <5.7)
Mean Plasma Glucose: 128 mg/dL
eAG (mmol/L): 7.1 mmol/L

## 2019-12-31 MED ORDER — BENAZEPRIL HCL 10 MG PO TABS: 20.0000 mg | ORAL_TABLET | Freq: Every day | ORAL | 3 refills | Status: DC

## 2019-12-31 MED ORDER — HYDROCHLOROTHIAZIDE 25 MG PO TABS: 25.0000 mg | ORAL_TABLET | Freq: Every day | ORAL | 3 refills | Status: DC

## 2019-12-31 NOTE — Patient Instructions (Addendum)
Thank you for coming to the office today.    Recent Labs    12/30/19 0829  HGBA1C 6.1*    Diet Recommendations for Preventing Diabetes   LIMIT Starchy (carb) foods include: Bread, rice, pasta, potatoes, corn, crackers, bagels, muffins, all baked goods.   Prefer Protein foods include: Meat, fish, poultry, eggs, dairy foods, and beans such as pinto and kidney beans (beans also provide carbohydrate).   1. Eat at least 3 meals and 1-2 snacks per day. Never go more than 4-5 hours while awake without eating.   2. Limit starchy foods to TWO per meal and ONE per snack. ONE portion of a starchy  food is equal to the following:   - ONE slice of bread (or its equivalent, such as half of a hamburger bun).   - 1/2 cup of a "scoopable" starchy food such as potatoes or rice.   - 1 OUNCE (28 grams) of starchy snacks (crackers or pretzels, look on label).   - 15 grams of carbohydrate as shown on food label.   3. Both lunch and dinner should include a protein food, a carb food, and vegetables.   - Obtain twice as many veg's as protein or carbohydrate foods for both lunch and dinner.   - Try to keep frozen veg's on hand for a quick vegetable serving.     - Fresh or frozen veg's are best.   4. Breakfast should always include protein.        Please schedule a Follow-up Appointment to: Return in about 6 months (around 06/30/2020) for 6 month follow-up PreDM A1c HTN, ENT f/u.  If you have any other questions or concerns, please feel free to call the office or send a message through [place]. You may also schedule an earlier appointment if necessary.  Additionally, you may be receiving a survey about your experience at our office within a few days to 1 week by e-mail or mail. We value your feedback.  Nobie Putnam, DO [place]

## 2019-12-31 NOTE — Progress Notes (Signed)
Subjective:    Patient ID: Regina Barber, female    DOB: 16-Oct-1960, 59 y.o.   MRN: [id number]  Regina Barber is a 59 y.o. female presenting on 12/31/2019 for Annual Exam   HPI   Here for Annual Physical and Lab Review.  CHRONIC HTN: Reports no new concerns Current Meds - Benazepril 20mg  daily, HCTZ 25mg  daily   Reports good compliance, took meds today. Tolerating well, w/o complaints. Denies CP, dyspnea, HA, edema, dizziness / lightheadedness  Pre-Diabetes Last lab A1c up to 6.1, previously 5.7 to 6.0 CBGs:Not checking Meds:never on meds CurrentlyonACEi Taking ASA 81 Lifestyle: - Diet (Reports not always adhering to diet, she has been eating more honey bun or and snacks / carb / sugary) - Exercise (Walking regularly at work) Denies hypoglycemia,polyuria, visual changes, numbness or tingling.  HYPERLIPIDEMIA: - Reports no concerns. Last lipid panel 12/2019, mostly controlled mild elevated LDL 112 Not taking Statin therapy  Additional complaints Recurrence of Left neck mass nodular density and dizziness with episodic L ear pain, last discussed 2019, she mentions these issues bothering her still.  Health Maintenance: Due for Flu Shot, declines today despite counseling on benefits  UTD COVID Vaccine including booster, needs copy of updated card.  Depression screen [facility] 2/9 03/15/2019 08/23/2017 05/19/2017  Decreased Interest 0 0 0  Down, Depressed, Hopeless 0 0 0  PHQ - 2 Score 0 0 0    Past Medical History:  Diagnosis Date  . Allergy   . Asthma   . Genital warts   . Hypertension    Past Surgical History:  Procedure Laterality Date  . ABDOMINAL HYSTERECTOMY  1987   Bilateral ovaries spared, reportedly cervix removed.  . COLONOSCOPY WITH PROPOFOL N/A 06/27/2016   Procedure: COLONOSCOPY WITH PROPOFOL;  Surgeon: Lucilla Lame, MD;  Location: [facility];  Service: Endoscopy;  Laterality: N/A;  . POLYPECTOMY  06/27/2016   Procedure: POLYPECTOMY;   Surgeon: Lucilla Lame, MD;  Location[facility];  Service: Endoscopy;;   Social History   Socioeconomic History  . Marital status: Married    Spouse name: Not on file  . Number of children: 2  . Years of education: Associate's Degree  . Highest education level: Not on file  Occupational History  . Occupation: School Custodian  Tobacco Use  . Smoking status: Former Smoker    Packs/day: 1.00    Years: 30.00    Pack years: 30.00    Types: Cigarettes    Quit date: 2008    Years since quitting: 13.9  . Smokeless tobacco: Never Used  . Tobacco comment: Quit on own after birth of daughter  Vaping Use  . Vaping Use: Never used  Substance and Sexual Activity  . Alcohol use: No  . Drug use: No    Comment: Former history of substance use  . Sexual activity: Not on file  Other Topics Concern  . Not on file  Social History Narrative  . Not on file   Social Determinants of Health   Financial Resource Strain: Not on file  Food Insecurity: Not on file  Transportation Needs: Not on file  Physical Activity: Not on file  Stress: Not on file  Social Connections: Not on file  Intimate Partner Violence: Not on file   Family History  Problem Relation Age of Onset  . Cirrhosis Father 66   Current Outpatient Medications on File Prior to Visit  Medication Sig  . albuterol (VENTOLIN HFA) 108 (90 Base) MCG/ACT inhaler Inhale 1-2 puffs  into the lungs every 4 (four) hours as needed for wheezing or shortness of breath.  Marland Kitchen aspirin EC 81 MG tablet Take 81 mg by mouth daily.  . cetirizine (ZYRTEC) 10 MG tablet Take 1 tablet (10 mg total) by mouth daily.  . montelukast (SINGULAIR) 10 MG tablet TAKE 1 TABLET BY MOUTH EVERYDAY AT BEDTIME   No current facility-administered medications on file prior to visit.    Review of Systems  Constitutional: Negative for activity change, appetite change, chills, diaphoresis, fatigue and fever.  HENT: Negative for congestion and hearing loss.    Eyes: Negative for visual disturbance.  Respiratory: Negative for apnea, cough, chest tightness, shortness of breath and wheezing.   Cardiovascular: Negative for chest pain, palpitations and leg swelling.  Gastrointestinal: Negative for abdominal pain, anal bleeding, blood in stool, constipation, diarrhea, nausea and vomiting.  Endocrine: Negative for cold intolerance.  Genitourinary: Negative for difficulty urinating, dysuria, frequency and hematuria.  Musculoskeletal: Negative for arthralgias, back pain and neck pain.  Skin: Negative for rash.  Allergic/Immunologic: Negative for environmental allergies.  Neurological: Positive for dizziness. Negative for weakness, light-headedness, numbness and headaches.  Hematological: Negative for adenopathy.  Psychiatric/Behavioral: Negative for behavioral problems, dysphoric mood and sleep disturbance. The patient is not nervous/anxious.    Per HPI unless specifically indicated above     Objective:    BP 116/86   Pulse 91   Temp (!) 97.1 F (36.2 C) (Temporal)   Ht 5\' 2"  (1.575 m)   Wt 134 lb 9.6 oz (61.1 kg)   SpO2 100%   BMI 24.62 kg/m   Wt Readings from Last 3 Encounters:  12/31/19 134 lb 9.6 oz (61.1 kg)  11/24/17 130 lb (59 kg)  11/13/17 124 lb (56.2 kg)    Physical Exam Vitals and nursing note reviewed.  Constitutional:      General: She is not in acute distress.    Appearance: She is well-developed and well-nourished. She is not diaphoretic.     Comments: Well-appearing, comfortable, cooperative  HENT:     Head: Normocephalic and atraumatic.     Right Ear: Tympanic membrane, ear canal and external ear normal.     Left Ear: Tympanic membrane, ear canal and external ear normal.     Mouth/Throat:     Mouth: Oropharynx is clear and moist.  Eyes:     General:        Right eye: No discharge.        Left eye: No discharge.     Extraocular Movements: EOM normal.     Conjunctiva/sclera: Conjunctivae normal.     Pupils: Pupils  are equal, round, and reactive to light.  Neck:     Thyroid: No thyromegaly.     Vascular: No carotid bruit.     Comments: Left neck submandibular mild enlargement of nodular density, non tender Cardiovascular:     Rate and Rhythm: Normal rate and regular rhythm.     Pulses: Intact distal pulses.     Heart sounds: Normal heart sounds. No murmur heard.   Pulmonary:     Effort: Pulmonary effort is normal. No respiratory distress.     Breath sounds: Normal breath sounds. No wheezing or rales.  Abdominal:     General: Bowel sounds are normal. There is no distension.     Palpations: Abdomen is soft. There is no mass.     Tenderness: There is no abdominal tenderness.  Musculoskeletal:        General: No tenderness or edema.  Normal range of motion.     Cervical back: Normal range of motion and neck supple.     Comments: Upper / Lower Extremities: - Normal muscle tone, strength bilateral upper extremities 5/5, lower extremities 5/5  Lymphadenopathy:     Cervical: No cervical adenopathy.  Skin:    General: Skin is warm and dry.     Findings: No erythema or rash.  Neurological:     Mental Status: She is alert and oriented to person, place, and time.     Comments: Distal sensation intact to light touch all extremities  Psychiatric:        Mood and Affect: Mood and affect normal.        Behavior: Behavior normal.     Comments: Well groomed, good eye contact, normal speech and thoughts    Results for orders placed or performed in visit on 12/27/19  TSH  Result Value Ref Range   TSH 1.03 0.40 - 4.50 mIU/L  CBC with Differential/Platelet  Result Value Ref Range   WBC 5.4 3.8 - 10.8 Thousand/uL   RBC 4.78 3.80 - 5.10 Million/uL   Hemoglobin 13.2 11.7 - 15.5 g/dL   HCT 39.6 35.0 - 45.0 %   MCV 82.8 80.0 - 100.0 fL   MCH 27.6 27.0 - 33.0 pg   MCHC 33.3 32.0 - 36.0 g/dL   RDW 14.0 11.0 - 15.0 %   Platelets 273 140 - 400 Thousand/uL   MPV 11.5 7.5 - 12.5 fL   Neutro Abs 2,975 1,500 -  7,800 cells/uL   Lymphs Abs 1,858 850 - 3,900 cells/uL   Absolute Monocytes 416 200 - 950 cells/uL   Eosinophils Absolute 130 15 - 500 cells/uL   Basophils Absolute 22 0 - 200 cells/uL   Neutrophils Relative % 55.1 %   Total Lymphocyte 34.4 %   Monocytes Relative 7.7 %   Eosinophils Relative 2.4 %   Basophils Relative 0.4 %  Lipid panel  Result Value Ref Range   Cholesterol 173 <200 mg/dL   HDL 44 (L) > OR = 50 mg/dL   Triglycerides 59 <150 mg/dL   LDL Cholesterol (Calc) 115 (H) mg/dL (calc)   Total CHOL/HDL Ratio 3.9 <5.0 (calc)   Non-HDL Cholesterol (Calc) 129 <130 mg/dL (calc)  Hemoglobin A1c  Result Value Ref Range   Hgb A1c MFr Bld 6.1 (H) <5.7 % of total Hgb   Mean Plasma Glucose 128 mg/dL   eAG (mmol/L) 7.1 mmol/L  COMPLETE METABOLIC PANEL WITH GFR  Result Value Ref Range   Glucose, Bld 86 65 - 99 mg/dL   BUN 20 7 - 25 mg/dL   Creat 1.04 0.50 - 1.05 mg/dL   GFR, Est Non African American 59 (L) > OR = 60 mL/min/1.24m2   GFR, Est African American 68 > OR = 60 mL/min/1.61m2   BUN/Creatinine Ratio NOT APPLICABLE 6 - 22 (calc)   Sodium 141 135 - 146 mmol/L   Potassium 3.7 3.5 - 5.3 mmol/L   Chloride 104 98 - 110 mmol/L   CO2 29 20 - 32 mmol/L   Calcium 9.8 8.6 - 10.4 mg/dL   Total Protein 7.4 6.1 - 8.1 g/dL   Albumin 4.4 3.6 - 5.1 g/dL   Globulin 3.0 1.9 - 3.7 g/dL (calc)   AG Ratio 1.5 1.0 - 2.5 (calc)   Total Bilirubin 0.4 0.2 - 1.2 mg/dL   Alkaline phosphatase (APISO) 68 37 - 153 U/L   AST 15 10 - 35 U/L   ALT  16 6 - 29 U/L      Assessment & Plan:   Problem List Items Addressed This Visit    Pre-diabetes    Mild elevated A1c 6.1  Plan:  1. Not on any therapy currently  2. Encourage improved lifestyle - low carb, low sugar diet, reduce portion size, continue improving regular exercise 3. Follow-up q 6 mo       Osteoarthritis of multiple joints    Stable chronic problem      Hyperlipidemia    Controlled cholesterol on lifestyle Last lipid panel  12/2-21 The 10-year ASCVD risk score Mikey Bussing DC Jr., et al., 2013) is: 4.8%  Plan: 1. Not indicated for statin 2. Continue ASA 81mg  for primary ASCVD risk reduction 3. Encourage improved lifestyle - low carb/cholesterol, reduce portion size, continue improving regular exercise      Relevant Medications   benazepril (LOTENSIN) 10 MG tablet   hydrochlorothiazide (HYDRODIURIL) 25 MG tablet   Essential hypertension    Well-controlled HTN  - Home BP readings none  No known complications   Plan:  1. Continue current BP regimen HCTZ 25mg , Lisinopril 20mg  - future may adjust dose if needed lower meds likely reduce thiazide 2. Encourage improved lifestyle - low sodium diet, regular exercise 3. Monitor BP outside office, bring readings to next visit, if persistently >140/90 or new symptoms notify office sooner      Relevant Medications   benazepril (LOTENSIN) 10 MG tablet   hydrochlorothiazide (HYDRODIURIL) 25 MG tablet   Environmental and seasonal allergies    Continue OTC allergy regimen       Other Visit Diagnoses    Annual physical exam    -  Primary      Updated Health Maintenance information Need COVID card to update Declines Flu Reviewed recent lab results with patient Encouraged improvement to lifestyle with diet and exercise - Goal of weight loss  Will contact us back if ready to do referral to ENT locally for these chronic concerns that have recurred, with L neck mass, dizziness, ear pain episodic.  Meds ordered this encounter  Medications  . benazepril (LOTENSIN) 10 MG tablet    Sig: Take 2 tablets (20 mg total) by mouth daily. Need follow-up visit for further refills    Dispense:  180 tablet    Refill:  3    Add refills on file  . hydrochlorothiazide (HYDRODIURIL) 25 MG tablet    Sig: Take 1 tablet (25 mg total) by mouth daily.    Dispense:  90 tablet    Refill:  3    Add refills on file, and update to 90 day      Follow up plan: Return in about 6 months  (around 06/30/2020) for 6 month follow-up PreDM A1c HTN, ENT f/u.  Nobie Putnam, DO [facility] 12/31/2019, 3:08 PM

## 2019-12-31 NOTE — Telephone Encounter (Signed)
Patient contacted [place] back today regarding prior acute on chronic symptoms, will pursue referral to ENT. This was discussed back in 2019 but the particular issues had resolved and lost to follow-up. Now she is here for physical today and also needed this referral sent.  Reason for Referral: Referral to ENT for acute on chronic issues with recurrent Left sided upper neck submandibular mass, also associated episodic dizziness and left ear pain. History of vertigo but now without vertigo, she had been monitored in past in 2019 however these issues seemed to resolve then recurrence again, now requesting consultation and further work up by ENT   Has the referral been discussed with the patient?: Yes   Designated contact for the referral if not the patient (name/phone number): patient   Has the patient seen a specialist for this issue before?: No  If so, who (practice/provider)?   Does the patient have a provider or location preference for the referral?: Yes - [facility] - [place] preferred  Would the patient like to see previous specialist if applicable?  Nobie Putnam, [facility] 12/31/2019, 3:26 PM

## 2020-01-01 NOTE — Assessment & Plan Note (Signed)
Continue OTC allergy regimen

## 2020-01-01 NOTE — Assessment & Plan Note (Signed)
Mild elevated A1c 6.1  Plan:  1. Not on any therapy currently  2. Encourage improved lifestyle - low carb, low sugar diet, reduce portion size, continue improving regular exercise 3. Follow-up q 6 mo

## 2020-01-01 NOTE — Assessment & Plan Note (Signed)
Controlled cholesterol on lifestyle Last lipid panel 12/2-21 The 10-year ASCVD risk score Regina Barber DC Jr., et al., 2013) is: 4.8%  Plan: 1. Not indicated for statin 2. Continue ASA 81mg  for primary ASCVD risk reduction 3. Encourage improved lifestyle - low carb/cholesterol, reduce portion size, continue improving regular exercise

## 2020-01-01 NOTE — Assessment & Plan Note (Addendum)
Well-controlled HTN  - Home BP readings none  No known complications   Plan:  1. Continue current BP regimen HCTZ 25mg , Lisinopril 20mg  - future may adjust dose if needed lower meds likely reduce thiazide 2. Encourage improved lifestyle - low sodium diet, regular exercise 3. Monitor BP outside office, bring readings to next visit, if persistently >140/90 or new symptoms notify office sooner

## 2020-01-01 NOTE — Assessment & Plan Note (Signed)
Stable chronic problem

## 2020-01-16 ENCOUNTER — Other Ambulatory Visit: Payer: Self-pay | Admitting: Otolaryngology

## 2020-01-16 ENCOUNTER — Other Ambulatory Visit (HOSPITAL_COMMUNITY): Payer: Self-pay | Admitting: Otolaryngology

## 2020-01-16 DIAGNOSIS — K111 Hypertrophy of salivary gland: Secondary | ICD-10-CM

## 2020-02-07 ENCOUNTER — Ambulatory Visit: Admission: RE | Admit: 2020-02-07 | Payer: BC Managed Care – PPO | Source: Ambulatory Visit

## 2020-02-19 ENCOUNTER — Other Ambulatory Visit: Payer: Self-pay

## 2020-02-19 ENCOUNTER — Ambulatory Visit
Admission: RE | Admit: 2020-02-19 | Discharge: 2020-02-19 | Disposition: A | Discharge status: Home / Self Care | Payer: BC Managed Care – PPO | Source: Ambulatory Visit | Attending: Otolaryngology | Admitting: Otolaryngology

## 2020-02-19 DIAGNOSIS — K111 Hypertrophy of salivary gland: Secondary | ICD-10-CM | POA: Diagnosis not present

## 2020-02-19 LAB — POCT I-STAT CREATININE: Creatinine, Ser: 1.1 mg/dL — ABNORMAL HIGH (ref 0.44–1.00)

## 2020-02-19 MED ORDER — IOHEXOL 300 MG/ML  SOLN
75.0000 mL | Freq: Once | INTRAMUSCULAR | Status: AC | PRN
  Administered 2020-02-19: 75 mL via INTRAVENOUS

## 2020-04-10 ENCOUNTER — Encounter: Payer: Self-pay | Admitting: Family Medicine

## 2020-04-10 DIAGNOSIS — N63 Unspecified lump in unspecified breast: Secondary | ICD-10-CM | POA: Insufficient documentation

## 2020-05-11 LAB — HM MAMMOGRAPHY

## 2020-05-12 NOTE — Progress Notes (Signed)
Abstracted Mammogram.

## 2020-07-05 ENCOUNTER — Other Ambulatory Visit: Payer: Self-pay | Admitting: Family Medicine

## 2020-07-05 DIAGNOSIS — J3089 Other allergic rhinitis: Secondary | ICD-10-CM

## 2020-07-05 DIAGNOSIS — J4520 Mild intermittent asthma, uncomplicated: Secondary | ICD-10-CM

## 2020-07-05 NOTE — Telephone Encounter (Signed)
Requested Prescriptions  Pending Prescriptions Disp Refills  . montelukast (SINGULAIR) 10 MG tablet [Pharmacy Med Name: MONTELUKAST SOD 10 MG TABLET] 90 tablet 1    Sig: TAKE 1 TABLET BY MOUTH EVERYDAY AT BEDTIME     Pulmonology:  Leukotriene Inhibitors Passed - 07/05/2020  1:18 PM      Passed - Valid encounter within last 12 months    Recent Outpatient Visits          6 months ago Annual physical exam   [place], DO   12 months ago Acute non-recurrent frontal sinusitis   [facility] [place], Devonne Doughty, DO   1 year ago Dysfunction of both eustachian tubes   [place], DO   1 year ago Episode of dizziness   [facility] [place], Devonne Doughty, DO   2 years ago Cough   [place], Devonne Doughty, DO      Future Appointments            Tomorrow [place], Devonne Doughty, [facility], [facility]

## 2020-07-06 ENCOUNTER — Other Ambulatory Visit: Payer: Self-pay | Admitting: Family Medicine

## 2020-07-06 ENCOUNTER — Encounter: Payer: Self-pay | Admitting: Family Medicine

## 2020-07-06 ENCOUNTER — Ambulatory Visit: Payer: Self-pay | Admitting: Family Medicine

## 2020-07-06 ENCOUNTER — Other Ambulatory Visit: Payer: Self-pay

## 2020-07-06 VITALS — BP 110/62 | HR 79 | Ht 62.0 in | Wt 131.2 lb

## 2020-07-06 DIAGNOSIS — I10 Essential (primary) hypertension: Secondary | ICD-10-CM

## 2020-07-06 DIAGNOSIS — R6884 Jaw pain: Secondary | ICD-10-CM

## 2020-07-06 DIAGNOSIS — R7303 Prediabetes: Secondary | ICD-10-CM

## 2020-07-06 DIAGNOSIS — G8929 Other chronic pain: Secondary | ICD-10-CM

## 2020-07-06 DIAGNOSIS — F458 Other somatoform disorders: Secondary | ICD-10-CM

## 2020-07-06 DIAGNOSIS — E7800 Pure hypercholesterolemia, unspecified: Secondary | ICD-10-CM

## 2020-07-06 DIAGNOSIS — Z0000 Encounter for general adult medical examination without abnormal findings: Secondary | ICD-10-CM

## 2020-07-06 LAB — POCT GLYCOSYLATED HEMOGLOBIN (HGB A1C): Hemoglobin A1C: 6.2 % — AB (ref 4.0–5.6)

## 2020-07-06 NOTE — Assessment & Plan Note (Signed)
Mild elevated A1c 6.2  Plan:  1. Not on any therapy currently  2. Encourage improved lifestyle - low carb, low sugar diet, reduce portion size, continue improving regular exercise 3. Follow-up q 6 mo

## 2020-07-06 NOTE — Patient Instructions (Addendum)
Thank you for coming to the office today.  Recent Labs    12/30/19 0829 07/06/20 0838  HGBA1C 6.1* 6.2*    DUE for FASTING BLOOD WORK (no food or drink after midnight before the lab appointment, only water or coffee without cream/sugar on the morning of)  SCHEDULE "Lab Only" visit in the morning at the clinic for lab draw in 6 MONTHS   - Make sure Lab Only appointment is at about 1 week before your next appointment, so that results will be available  For Lab Results, once available within 2-3 days of blood draw, you can can log in to MyChart online to view your results and a brief explanation. Also, we can discuss results at next follow-up visit.   Please schedule a Follow-up Appointment to: Return in about 6 months (around 01/05/2021) for 6 month fasting lab only then 1 week later Annual Physical.  If you have any other questions or concerns, please feel free to call the office or send a message through [place]. You may also schedule an earlier appointment if necessary.  Additionally, you may be receiving a survey about your experience at our office within a few days to 1 week by e-mail or mail. We value your feedback.  Nobie Putnam, DO [place]

## 2020-07-06 NOTE — Assessment & Plan Note (Signed)
Well-controlled HTN - Home BP readings none  No known complications   Plan:  1. Continue current BP regimen HCTZ 25mg , Lisinopril 20mg  2. Encourage improved lifestyle - low sodium diet, regular exercise 3. Monitor BP outside office, bring readings to next visit, if persistently >140/90 or new symptoms notify office sooner

## 2020-07-06 NOTE — Progress Notes (Signed)
Subjective:    Patient ID: Regina Barber, female    DOB: Jun 09, 1960, 60 y.o.   MRN: [id number]  Regina Barber is a 60 y.o. female presenting on 07/06/2020 for Prediabetes and Hypertension   HPI  CHRONIC HTN: Reports no new concerns Current Meds - Benazepril 20mg  daily, HCTZ 25mg  daily   Reports good compliance, took meds today. Tolerating well, w/o complaints. Denies CP, dyspnea, HA, edema, dizziness / lightheadedness   Pre-Diabetes Last lab A1c up to 6.1 now today A1c due CBGs: Not checking Meds: never on meds Currently on ACEi Taking ASA 81 Lifestyle: - Diet (Reports not always adhering to diet, she has been eating more honey bun or and snacks / carb / sugary) - Exercise (Walking regularly at work) Denies hypoglycemia, polyuria, visual changes, numbness or tingling.   Left Ear / Jaw Pain Reports history of bruxism / grinding teeth, she has concern with pains in Left jaw and face she has upcoming dental apt, concerned about TMJ. She has been evaluated by ENT Dr Kathyrn Sheriff - had evaluation and CT without abnormality. - She says that she feels the bruxism seems linked to the pain.    Health Maintenance: UTD COVID Vaccine x 3 doses Declines Shingrix.  Depression screen [facility] 2/9 07/06/2020 03/15/2019 08/23/2017  Decreased Interest 0 0 0  Down, Depressed, Hopeless 0 0 0  PHQ - 2 Score 0 0 0  Altered sleeping 0 - -  Tired, decreased energy 0 - -  Change in appetite 0 - -  Feeling bad or failure about yourself  0 - -  Trouble concentrating 0 - -  Moving slowly or fidgety/restless 0 - -  Suicidal thoughts 0 - -  PHQ-9 Score 0 - -  Difficult doing work/chores Not difficult at all - -    Social History   Tobacco Use   Smoking status: Former    Packs/day: 1.00    Years: 30.00    Pack years: 30.00    Types: Cigarettes    Quit date: 2008    Years since quitting: 14.4   Smokeless tobacco: Never   Tobacco comments:    Quit on own after birth of daughter  Vaping Use    Vaping Use: Never used  Substance Use Topics   Alcohol use: No   Drug use: No    Comment: Former history of substance use    Review of Systems Per HPI unless specifically indicated above     Objective:    BP 110/62   Pulse 79   Ht 5\' 2"  (1.575 m)   Wt 131 lb 3.2 oz (59.5 kg)   SpO2 100%   BMI 24.00 kg/m   Wt Readings from Last 3 Encounters:  07/06/20 131 lb 3.2 oz (59.5 kg)  12/31/19 134 lb 9.6 oz (61.1 kg)  11/24/17 130 lb (59 kg)    Physical Exam Vitals and nursing note reviewed.  Constitutional:      General: She is not in acute distress.    Appearance: Normal appearance. She is well-developed. She is not diaphoretic.     Comments: Well-appearing, comfortable, cooperative  HENT:     Head: Normocephalic and atraumatic.  Eyes:     General:        Right eye: No discharge.        Left eye: No discharge.     Conjunctiva/sclera: Conjunctivae normal.  Cardiovascular:     Rate and Rhythm: Normal rate.  Pulmonary:     Effort: Pulmonary effort is  normal.  Skin:    General: Skin is warm and dry.     Findings: No erythema or rash.  Neurological:     Mental Status: She is alert and oriented to person, place, and time.  Psychiatric:        Mood and Affect: Mood normal.        Behavior: Behavior normal.        Thought Content: Thought content normal.     Comments: Well groomed, good eye contact, normal speech and thoughts    Recent Labs    12/30/19 0829 07/06/20 0838  HGBA1C 6.1* 6.2*     Results for orders placed or performed in visit on 07/06/20  POCT HgB A1C  Result Value Ref Range   Hemoglobin A1C 6.2 (A) 4.0 - 5.6 %    I have personally reviewed the radiology report from 02/19/20 on CT.   CLINICAL DATA:  Left-sided facial swelling   EXAM: CT NECK WITH CONTRAST   TECHNIQUE: Multidetector CT imaging of the neck was performed using the standard protocol following the bolus administration of intravenous contrast.   CONTRAST:  30mL OMNIPAQUE IOHEXOL  300 MG/ML  SOLN   COMPARISON:  None.   FINDINGS: PHARYNX AND LARYNX: The nasopharynx, oropharynx and larynx are normal. Visible portions of the oral cavity, tongue base and floor of mouth are normal. Normal epiglottis, vallecula and pyriform sinuses. The larynx is normal. No retropharyngeal abscess, effusion or lymphadenopathy. Torus palatini.   SALIVARY GLANDS: There are few punctate calcifications in the inferior aspect of the left parotid gland. There also a few calcifications in the left submandibular gland. There is mild bilateral submandibular ductal ectasia. No stones within the salivary ducts.   THYROID: Normal.   LYMPH NODES: No enlarged or abnormal density lymph nodes.   VASCULAR: Major cervical vessels are patent.   LIMITED INTRACRANIAL: Normal.   VISUALIZED ORBITS: Normal.   MASTOIDS AND VISUALIZED PARANASAL SINUSES: No fluid levels or advanced mucosal thickening. No mastoid effusion.   SKELETON: No bony spinal canal stenosis. No lytic or blastic lesions.   UPPER CHEST: Clear.   OTHER: None.   IMPRESSION: 1. Punctate calcifications in the inferior aspect of the left parotid gland and left submandibular gland, consistent with sialolithiasis. No stones within the carotid ducts. 2. Mild bilateral submandibular ductal ectasia without stones within the submandibular ducts. 3. No focal abnormality to explain the sensation of left facial swelling.     Electronically Signed   By: Ulyses Jarred M.D.   On: 02/19/2020 21:27    Assessment & Plan:   Problem List Items Addressed This Visit     Pre-diabetes - Primary    Mild elevated A1c 6.2  Plan:  1. Not on any therapy currently  2. Encourage improved lifestyle - low carb, low sugar diet, reduce portion size, continue improving regular exercise 3. Follow-up q 6 mo        Relevant Orders   POCT HgB A1C (Completed)   Essential hypertension    Well-controlled HTN - Home BP readings none  No known  complications   Plan:  1. Continue current BP regimen HCTZ 25mg , Lisinopril 20mg  2. Encourage improved lifestyle - low sodium diet, regular exercise 3. Monitor BP outside office, bring readings to next visit, if persistently >140/90 or new symptoms notify office sooner       Other Visit Diagnoses     Bruxism (teeth grinding)       Chronic jaw pain  Has seen ENT for jaw, now scheduled w/ Dentist Had CT done Clinically with grinding/bruxism causing TMJ pain it seems.  No orders of the defined types were placed in this encounter.     Follow up plan: Return in about 6 months (around 01/05/2021) for 6 month fasting lab only then 1 week later Annual Physical.  Future labs ordered for 01/04/21   Nobie Putnam, [facility] 07/06/2020, 8:30 AM

## 2020-10-05 ENCOUNTER — Other Ambulatory Visit: Payer: Self-pay | Admitting: Family Medicine

## 2020-10-05 DIAGNOSIS — J4520 Mild intermittent asthma, uncomplicated: Secondary | ICD-10-CM

## 2021-01-01 ENCOUNTER — Other Ambulatory Visit: Payer: Self-pay | Admitting: Family Medicine

## 2021-01-01 ENCOUNTER — Other Ambulatory Visit: Payer: Self-pay

## 2021-01-01 DIAGNOSIS — R7303 Prediabetes: Secondary | ICD-10-CM

## 2021-01-01 DIAGNOSIS — I10 Essential (primary) hypertension: Secondary | ICD-10-CM

## 2021-01-01 DIAGNOSIS — Z0000 Encounter for general adult medical examination without abnormal findings: Secondary | ICD-10-CM

## 2021-01-01 DIAGNOSIS — E7800 Pure hypercholesterolemia, unspecified: Secondary | ICD-10-CM

## 2021-01-01 NOTE — Telephone Encounter (Signed)
Requested Prescriptions  Pending Prescriptions Disp Refills   benazepril (LOTENSIN) 10 MG tablet [Pharmacy Med Name: BENAZEPRIL HCL 10 MG TABLET] 180 tablet 0    Sig: TAKE 2 TABLETS (20 MG TOTAL) BY MOUTH DAILY. NEED FOLLOW-UP VISIT FOR FURTHER REFILLS     Cardiovascular:  ACE Inhibitors Failed - 01/01/2021  1:34 AM      Failed - Cr in normal range and within 180 days    Creat  Date Value Ref Range Status  12/30/2019 1.04 0.50 - 1.05 mg/dL Final    Comment:    For patients >70 years of age, the reference limit for Creatinine is approximately 13% higher for people identified as African-American. .    Creatinine, Ser  Date Value Ref Range Status  02/19/2020 1.10 (H) 0.44 - 1.00 mg/dL Final         Failed - K in normal range and within 180 days    Potassium  Date Value Ref Range Status  12/30/2019 3.7 3.5 - 5.3 mmol/L Final  04/11/2015 4.3 mmol/L Final         Passed - Patient is not pregnant      Passed - Last BP in normal range    BP Readings from Last 1 Encounters:  07/06/20 110/62         Passed - Valid encounter within last 6 months    Recent Outpatient Visits          5 months ago Pre-diabetes   [place], DO   1 year ago Annual physical exam   [facility] Olin Hauser, DO   1 year ago Acute non-recurrent frontal sinusitis   [place], DO   1 year ago Dysfunction of both eustachian tubes   [place], DO   1 year ago Episode of dizziness   [place], Devonne Doughty, DO      Future Appointments            In 5 days [place], Devonne Doughty, [facility], [facility]

## 2021-01-04 ENCOUNTER — Other Ambulatory Visit: Payer: BC Managed Care – PPO

## 2021-01-04 ENCOUNTER — Encounter: Payer: Self-pay | Admitting: Family Medicine

## 2021-01-04 ENCOUNTER — Other Ambulatory Visit: Payer: Self-pay

## 2021-01-05 LAB — CBC WITH DIFFERENTIAL/PLATELET
Absolute Monocytes: 519 cells/uL (ref 200–950)
Basophils Absolute: 20 cells/uL (ref 0–200)
Basophils Relative: 0.4 %
Eosinophils Absolute: 162 cells/uL (ref 15–500)
Eosinophils Relative: 3.3 %
HCT: 38.3 % (ref 35.0–45.0)
Hemoglobin: 12.5 g/dL (ref 11.7–15.5)
Lymphs Abs: 1852 cells/uL (ref 850–3900)
MCH: 27.2 pg (ref 27.0–33.0)
MCHC: 32.6 g/dL (ref 32.0–36.0)
MCV: 83.4 fL (ref 80.0–100.0)
MPV: 11.3 fL (ref 7.5–12.5)
Monocytes Relative: 10.6 %
Neutro Abs: 2347 cells/uL (ref 1500–7800)
Neutrophils Relative %: 47.9 %
Platelets: 298 10*3/uL (ref 140–400)
RBC: 4.59 10*6/uL (ref 3.80–5.10)
RDW: 14 % (ref 11.0–15.0)
Total Lymphocyte: 37.8 %
WBC: 4.9 10*3/uL (ref 3.8–10.8)

## 2021-01-05 LAB — COMPLETE METABOLIC PANEL WITH GFR
AG Ratio: 1.4 (calc) (ref 1.0–2.5)
ALT: 11 U/L (ref 6–29)
AST: 12 U/L (ref 10–35)
Albumin: 4.1 g/dL (ref 3.6–5.1)
Alkaline phosphatase (APISO): 62 U/L (ref 37–153)
BUN: 21 mg/dL (ref 7–25)
CO2: 27 mmol/L (ref 20–32)
Calcium: 9.4 mg/dL (ref 8.6–10.4)
Chloride: 106 mmol/L (ref 98–110)
Creat: 0.99 mg/dL (ref 0.50–1.05)
Globulin: 3 g/dL (calc) (ref 1.9–3.7)
Glucose, Bld: 89 mg/dL (ref 65–99)
Potassium: 3.7 mmol/L (ref 3.5–5.3)
Sodium: 143 mmol/L (ref 135–146)
Total Bilirubin: 0.3 mg/dL (ref 0.2–1.2)
Total Protein: 7.1 g/dL (ref 6.1–8.1)
eGFR: 65 mL/min/{1.73_m2} (ref >=60)

## 2021-01-05 LAB — LIPID PANEL
Cholesterol: 163 mg/dL (ref <200)
HDL: 44 mg/dL — ABNORMAL LOW (ref >=50)
LDL Cholesterol (Calc): 103 mg/dL (calc) — ABNORMAL HIGH
Non-HDL Cholesterol (Calc): 119 mg/dL (calc) (ref <130)
Total CHOL/HDL Ratio: 3.7 (calc) (ref <5.0)
Triglycerides: 74 mg/dL (ref <150)

## 2021-01-05 LAB — TSH: TSH: 1.17 mIU/L (ref 0.40–4.50)

## 2021-01-05 LAB — HEMOGLOBIN A1C
Hgb A1c MFr Bld: 5.8 % of total Hgb — ABNORMAL HIGH (ref <5.7)
Mean Plasma Glucose: 120 mg/dL
eAG (mmol/L): 6.6 mmol/L

## 2021-01-06 ENCOUNTER — Encounter: Payer: Self-pay | Admitting: Family Medicine

## 2021-01-07 ENCOUNTER — Other Ambulatory Visit: Payer: Self-pay

## 2021-01-07 ENCOUNTER — Encounter: Payer: Self-pay | Admitting: Family Medicine

## 2021-01-07 ENCOUNTER — Ambulatory Visit (INDEPENDENT_AMBULATORY_CARE_PROVIDER_SITE_OTHER): Payer: BC Managed Care – PPO | Admitting: Family Medicine

## 2021-01-07 VITALS — BP 116/67 | HR 73 | Ht 62.0 in | Wt 133.0 lb

## 2021-01-07 DIAGNOSIS — Z0000 Encounter for general adult medical examination without abnormal findings: Secondary | ICD-10-CM

## 2021-01-07 DIAGNOSIS — J4520 Mild intermittent asthma, uncomplicated: Secondary | ICD-10-CM

## 2021-01-07 DIAGNOSIS — I10 Essential (primary) hypertension: Secondary | ICD-10-CM

## 2021-01-07 MED ORDER — ALBUTEROL SULFATE HFA 108 (90 BASE) MCG/ACT IN AERS: 1.0000 | INHALATION_SPRAY | RESPIRATORY_TRACT | 2 refills | Status: DC | PRN

## 2021-01-07 MED ORDER — BENAZEPRIL HCL 10 MG PO TABS: 20.0000 mg | ORAL_TABLET | Freq: Every day | ORAL | 3 refills | Status: DC

## 2021-01-07 MED ORDER — HYDROCHLOROTHIAZIDE 25 MG PO TABS: 25.0000 mg | ORAL_TABLET | Freq: Every day | ORAL | 3 refills | Status: DC

## 2021-01-07 NOTE — Assessment & Plan Note (Signed)
Well-controlled HTN - Home BP readings none  No known complications   Plan:  1. Continue current BP regimen HCTZ 25mg , Lisinopril 20mg  2. Encourage improved lifestyle - low sodium diet, regular exercise 3. Monitor BP outside office, bring readings to next visit, if persistently >140/90 or new symptoms notify office sooner

## 2021-01-07 NOTE — Patient Instructions (Addendum)
Thank you for coming to the office today.  Recent Labs    07/06/20 0838 01/04/21 0814  HGBA1C 6.2* 5.8*   Meds refilled for 1 year including inhaler.  Keep up the great work.  Please schedule a Follow-up Appointment to: Return in about 6 months (around 07/08/2021) for 6 month PreDM A1c.  If you have any other questions or concerns, please feel free to call the office or send a message through [place]. You may also schedule an earlier appointment if necessary.  Additionally, you may be receiving a survey about your experience at our office within a few days to 1 week by e-mail or mail. We value your feedback.  Nobie Putnam, DO [place]

## 2021-01-07 NOTE — Progress Notes (Signed)
Subjective:    Patient ID: Regina Barber, female    DOB: May 28, 1960, 60 y.o.   MRN: [id number]  Regina Barber is a 60 y.o. female presenting on 01/07/2021 for Annual Exam   HPI  Here for Annual Physical and Lab Review.  CHRONIC HTN: Reports no new concerns. Needs rx Current Meds - Benazepril 12m daily, HCTZ 273mdaily   Reports good compliance, took meds today. Tolerating well, w/o complaints. Denies CP, dyspnea, HA, edema, dizziness / lightheadedness   Pre-Diabetes Last lab A1c shows improvement 5.8, previously 6.2 range CBGs: Not checking Meds: never on meds Currently on ACEi Taking ASA 81 Lifestyle: - Diet (improved diet regimen) - Exercise (Walking regularly at work) Denies hypoglycemia, polyuria, visual changes, numbness or tingling.   Hyperlipidemia LDL down from 115 to 103. And total cholesterol 163  L jaw Pain Reports history of bruxism / grinding teeth Dentist has identified issue with jaw and this has been resolved. Improved symptoms.  Health Maintenance: Declines Shingrix. COVID may consider updated booster Declines Flu Shot Mammogram due in April 2022   [facility] 01/07/2021 07/06/2020 03/15/2019  Decreased Interest 0 0 0  Down, Depressed, Hopeless 0 0 0  PHQ - 2 Score 0 0 0  Altered sleeping 0 0 -  Tired, decreased energy 0 0 -  Change in appetite 0 0 -  Feeling bad or failure about yourself  0 0 -  Trouble concentrating 0 0 -  Moving slowly or fidgety/restless 0 0 -  Suicidal thoughts 0 0 -  PHQ-9 Score 0 0 -  Difficult doing work/chores Not difficult at all Not difficult at all -    Past Medical History:  Diagnosis Date   Allergy    Asthma    Genital warts    Hypertension    Past Surgical History:  Procedure Laterality Date   ABDOMINAL HYSTERECTOMY  1987   Bilateral ovaries spared, reportedly cervix removed.   COLONOSCOPY WITH PROPOFOL N/A 06/27/2016   Procedure: COLONOSCOPY WITH PROPOFOL;  Surgeon: WoLucilla Lame MD;  [facility] Service: Endoscopy;  Laterality: N/A;   POLYPECTOMY  06/27/2016   Procedure: POLYPECTOMY;  Surgeon: WoLucilla LameMD;  [facility];  Service: Endoscopy;;   Social History   Socioeconomic History   Marital status: Married    Spouse name: Not on file   Number of children: 2   Years of education: Associate's Degree   Highest education level: Not on file  Occupational History   Occupation: School Custodian  Tobacco Use   Smoking status: Former    Packs/day: 1.00    Years: 30.00    Pack years: 30.00    Types: Cigarettes    Quit date: 2008    Years since quitting: 14.9   Smokeless tobacco: Never   Tobacco comments:    Quit on own after birth of daughter  Vaping Use   Vaping Use: Never used  Substance and Sexual Activity   Alcohol use: No   Drug use: No    Comment: Former history of substance use   Sexual activity: Not on file  Other Topics Concern   Not on file  Social History Narrative   Not on file   Social Determinants of Health   Financial Resource Strain: Not on file  Food Insecurity: Not on file  Transportation Needs: Not on file  Physical Activity: Not on file  Stress: Not on file  Social Connections: Not on file  Intimate Partner  Violence: Not on file   Family History  Problem Relation Age of Onset   Cirrhosis Father 85   Current Outpatient Medications on File Prior to Visit  Medication Sig   aspirin EC 81 MG tablet Take 81 mg by mouth daily.   cetirizine (ZYRTEC) 10 MG tablet Take 1 tablet (10 mg total) by mouth daily.   montelukast (SINGULAIR) 10 MG tablet TAKE 1 TABLET BY MOUTH EVERYDAY AT BEDTIME   No current facility-administered medications on file prior to visit.    Review of Systems  Constitutional:  Negative for activity change, appetite change, chills, diaphoresis, fatigue and fever.  HENT:  Negative for congestion and hearing loss.   Eyes:  Negative for visual disturbance.  Respiratory:   Negative for cough, chest tightness, shortness of breath and wheezing.   Cardiovascular:  Negative for chest pain, palpitations and leg swelling.  Gastrointestinal:  Negative for abdominal pain, constipation, diarrhea, nausea and vomiting.  Genitourinary:  Negative for dysuria, frequency and hematuria.  Musculoskeletal:  Negative for arthralgias and neck pain.  Skin:  Negative for rash.  Neurological:  Negative for dizziness, weakness, light-headedness, numbness and headaches.  Hematological:  Negative for adenopathy.  Psychiatric/Behavioral:  Negative for behavioral problems, dysphoric mood and sleep disturbance.   Per HPI unless specifically indicated above     Objective:    BP 116/67    Pulse 73    Ht '5\' 2"'  (1.575 m)    Wt 133 lb (60.3 kg)    SpO2 98%    BMI 24.33 kg/m   Wt Readings from Last 3 Encounters:  01/07/21 133 lb (60.3 kg)  07/06/20 131 lb 3.2 oz (59.5 kg)  12/31/19 134 lb 9.6 oz (61.1 kg)    Physical Exam Vitals and nursing note reviewed.  Constitutional:      General: She is not in acute distress.    Appearance: She is well-developed. She is not diaphoretic.     Comments: Well-appearing, comfortable, cooperative  HENT:     Head: Normocephalic and atraumatic.  Eyes:     General:        Right eye: No discharge.        Left eye: No discharge.     Conjunctiva/sclera: Conjunctivae normal.     Pupils: Pupils are equal, round, and reactive to light.  Neck:     Thyroid: No thyromegaly.     Vascular: No carotid bruit.  Cardiovascular:     Rate and Rhythm: Normal rate and regular rhythm.     Pulses: Normal pulses.     Heart sounds: Normal heart sounds. No murmur heard. Pulmonary:     Effort: Pulmonary effort is normal. No respiratory distress.     Breath sounds: Normal breath sounds. No wheezing or rales.  Abdominal:     General: Bowel sounds are normal. There is no distension.     Palpations: Abdomen is soft. There is no mass.     Tenderness: There is no  abdominal tenderness.  Musculoskeletal:        General: No tenderness. Normal range of motion.     Cervical back: Normal range of motion and neck supple.     Right lower leg: No edema.     Left lower leg: No edema.     Comments: Upper / Lower Extremities: - Normal muscle tone, strength bilateral upper extremities 5/5, lower extremities 5/5  Lymphadenopathy:     Cervical: No cervical adenopathy.  Skin:    General: Skin is warm and dry.  Findings: No erythema or rash.  Neurological:     Mental Status: She is alert and oriented to person, place, and time.     Comments: Distal sensation intact to light touch all extremities  Psychiatric:        Mood and Affect: Mood normal.        Behavior: Behavior normal.        Thought Content: Thought content normal.     Comments: Well groomed, good eye contact, normal speech and thoughts    Results for orders placed or performed in visit on 01/01/21  TSH  Result Value Ref Range   TSH 1.17 0.40 - 4.50 mIU/L  Hemoglobin A1c  Result Value Ref Range   Hgb A1c MFr Bld 5.8 (H) <5.7 % of total Hgb   Mean Plasma Glucose 120 mg/dL   eAG (mmol/L) 6.6 mmol/L  Lipid panel  Result Value Ref Range   Cholesterol 163 <200 mg/dL   HDL 44 (L) > OR = 50 mg/dL   Triglycerides 74 <150 mg/dL   LDL Cholesterol (Calc) 103 (H) mg/dL (calc)   Total CHOL/HDL Ratio 3.7 <5.0 (calc)   Non-HDL Cholesterol (Calc) 119 <130 mg/dL (calc)  CBC with Differential/Platelet  Result Value Ref Range   WBC 4.9 3.8 - 10.8 Thousand/uL   RBC 4.59 3.80 - 5.10 Million/uL   Hemoglobin 12.5 11.7 - 15.5 g/dL   HCT 38.3 35.0 - 45.0 %   MCV 83.4 80.0 - 100.0 fL   MCH 27.2 27.0 - 33.0 pg   MCHC 32.6 32.0 - 36.0 g/dL   RDW 14.0 11.0 - 15.0 %   Platelets 298 140 - 400 Thousand/uL   MPV 11.3 7.5 - 12.5 fL   Neutro Abs 2,347 1,500 - 7,800 cells/uL   Lymphs Abs 1,852 850 - 3,900 cells/uL   Absolute Monocytes 519 200 - 950 cells/uL   Eosinophils Absolute 162 15 - 500 cells/uL    Basophils Absolute 20 0 - 200 cells/uL   Neutrophils Relative % 47.9 %   Total Lymphocyte 37.8 %   Monocytes Relative 10.6 %   Eosinophils Relative 3.3 %   Basophils Relative 0.4 %  COMPLETE METABOLIC PANEL WITH GFR  Result Value Ref Range   Glucose, Bld 89 65 - 99 mg/dL   BUN 21 7 - 25 mg/dL   Creat 0.99 0.50 - 1.05 mg/dL   eGFR 65 > OR = 60 mL/min/1.44m   BUN/Creatinine Ratio NOT APPLICABLE 6 - 22 (calc)   Sodium 143 135 - 146 mmol/L   Potassium 3.7 3.5 - 5.3 mmol/L   Chloride 106 98 - 110 mmol/L   CO2 27 20 - 32 mmol/L   Calcium 9.4 8.6 - 10.4 mg/dL   Total Protein 7.1 6.1 - 8.1 g/dL   Albumin 4.1 3.6 - 5.1 g/dL   Globulin 3.0 1.9 - 3.7 g/dL (calc)   AG Ratio 1.4 1.0 - 2.5 (calc)   Total Bilirubin 0.3 0.2 - 1.2 mg/dL   Alkaline phosphatase (APISO) 62 37 - 153 U/L   AST 12 10 - 35 U/L   ALT 11 6 - 29 U/L      Assessment & Plan:   Problem List Items Addressed This Visit     Essential hypertension    Well-controlled HTN - Home BP readings none  No known complications   Plan:  1. Continue current BP regimen HCTZ 22m Lisinopril 207m. Encourage improved lifestyle - low sodium diet, regular exercise 3. Monitor BP outside office, bring  readings to next visit, if persistently >140/90 or new symptoms notify office sooner      Relevant Medications   benazepril (LOTENSIN) 10 MG tablet   hydrochlorothiazide (HYDRODIURIL) 25 MG tablet   Asthma   Relevant Medications   albuterol (VENTOLIN HFA) 108 (90 Base) MCG/ACT inhaler   Other Visit Diagnoses     Annual physical exam    -  Primary      Updated Health Maintenance information COVID may consider updated booster Declines Flu Shot Mammogram due in April 2022 [facility] - notify us for order. Reviewed recent lab results with patient Encouraged improvement to lifestyle with diet and exercise Goal of weight loss    Asthma uncomplicated Needs back up rx Albuterol Inhaler   Meds ordered this encounter   Medications   benazepril (LOTENSIN) 10 MG tablet    Sig: Take 2 tablets (20 mg total) by mouth daily.    Dispense:  180 tablet    Refill:  3    Add refills to current rx   hydrochlorothiazide (HYDRODIURIL) 25 MG tablet    Sig: Take 1 tablet (25 mg total) by mouth daily.    Dispense:  90 tablet    Refill:  3    Add refills on file, and update to 90 day   albuterol (VENTOLIN HFA) 108 (90 Base) MCG/ACT inhaler    Sig: Inhale 1-2 puffs into the lungs every 4 (four) hours as needed for wheezing or shortness of breath.    Dispense:  8.5 each    Refill:  2    DX Code Needed  . J45.20      Follow up plan: Return in about 6 months (around 07/08/2021) for 6 month PreDM A1c.  Nobie Putnam, [facility] 01/07/2021, 8:56 AM

## 2021-01-29 ENCOUNTER — Ambulatory Visit: Payer: Self-pay | Admitting: *Deleted

## 2021-01-29 NOTE — Telephone Encounter (Signed)
°  Chief Complaint: dizziness Symptoms: dizziness, neck lymph node swollen, sweating yesterday Frequency: started yesterday Pertinent Negatives: Patient denies fever,weakness,diarrhea,vomiting Disposition: [] ED /[] Urgent Care (no appt availability in office) / [x] Appointment(In office/virtual)/ []  [place] [facility]/ [] Home Care/ [] Refused Recommended Disposition /[]  Mobile Bus/ []  Follow-up with PCP Additional Notes: Patient reports mild symptoms today- advised UC/call back for increased symptoms

## 2021-01-29 NOTE — Telephone Encounter (Signed)
Reason for Disposition  [1] MILD dizziness (e.g., walking normally) AND [2] has NOT been evaluated by physician for this  (Exception: dizziness caused by heat exposure, sudden standing, or poor fluid intake)  Answer Assessment - Initial Assessment Questions 1. DESCRIPTION: "Describe your dizziness."     Better than yesterday- woozy 2. LIGHTHEADED: "Do you feel lightheaded?" (e.g., somewhat faint, woozy, weak upon standing)     Woozy 3. VERTIGO: "Do you feel like either you or the room is spinning or tilting?" (i.e. vertigo)     no 4. SEVERITY: "How bad is it?"  "Do you feel like you are going to faint?" "Can you stand and walk?"   - MILD: Feels slightly dizzy, but walking normally.   - MODERATE: Feels unsteady when walking, but not falling; interferes with normal activities (e.g., school, work).   - SEVERE: Unable to walk without falling, or requires assistance to walk without falling; feels like passing out now.      mild 5. ONSET:  "When did the dizziness begin?"     Yesterday am 6. AGGRAVATING FACTORS: "Does anything make it worse?" (e.g., standing, change in head position)     Getting up from laying 7. HEART RATE: "Can you tell me your heart rate?" "How many beats in 15 seconds?"  (Note: not all patients can do this)       No change 8. CAUSE: "What do you think is causing the dizziness?"     Eating- ? Allergic reaction 9. RECURRENT SYMPTOM: "Have you had dizziness before?" If Yes, ask: "When was the last time?" "What happened that time?"     1 1/2 years 10. OTHER SYMPTOMS: "Do you have any other symptoms?" (e.g., fever, chest pain, vomiting, diarrhea, bleeding)       Sweating yesterday, swollen gland- neck 11. PREGNANCY: "Is there any chance you are pregnant?" "When was your last menstrual period?"       *No Answer*  Protocols used: Dizziness - Lightheadedness-A-AH

## 2021-02-01 ENCOUNTER — Other Ambulatory Visit: Payer: Self-pay

## 2021-02-01 ENCOUNTER — Encounter: Payer: Self-pay | Admitting: Family Medicine

## 2021-02-01 ENCOUNTER — Ambulatory Visit (INDEPENDENT_AMBULATORY_CARE_PROVIDER_SITE_OTHER): Payer: BC Managed Care – PPO | Admitting: Family Medicine

## 2021-02-01 VITALS — BP 109/83 | HR 86 | Ht 62.0 in | Wt 128.4 lb

## 2021-02-01 DIAGNOSIS — H8110 Benign paroxysmal vertigo, unspecified ear: Secondary | ICD-10-CM

## 2021-02-01 DIAGNOSIS — I10 Essential (primary) hypertension: Secondary | ICD-10-CM

## 2021-02-01 NOTE — Patient Instructions (Addendum)
Thank you for coming to the office today.  Go ahead and reduce the Benazepril from 2 pills = 20mg , down to 1 pill = 10mg  daily, and see how this goes with your BP.   In future we can adjust again if needed on this or the other BP medication.  Good news the dizziness has faded.  You MAY have symptoms of Vertigo (Benign Paroxysmal Positional Vertigo) - This is commonly caused by inner ear fluid imbalance, sometimes can be worsened by allergies and sinus symptoms, otherwise it can occur randomly sometimes and we may never discover the exact cause. - To treat this, try the Epley Manuever (see diagrams/instructions below) at home up to 3 times a day for 1-2 weeks or until symptoms resolve  If you develop significant worsening episode with vertigo that does not improve and you get severe headache, loss of vision, arm or leg weakness, slurred speech, or other concerning symptoms please seek immediate medical attention at Emergency Department.    See the next page for images describing the Epley Manuever.     ----------------------------------------------------------------------------------------------------------------------        Please schedule a Follow-up Appointment to: Return if symptoms worsen or fail to improve.  If you have any other questions or concerns, please feel free to call the office or send a message through [place]. You may also schedule an earlier appointment if necessary.  Additionally, you may be receiving a survey about your experience at our office within a few days to 1 week by e-mail or mail. We value your feedback.  Nobie Putnam, DO [place]

## 2021-02-01 NOTE — Assessment & Plan Note (Signed)
Well-controlled HTN Note recent change in ACEi manufacturer - Home BP readings none  No known complications   Plan:  1. REDUCE ACEi from Benazepril 10mg  x 2= 20mg  daily, now trial ONE pill or 10mg  daily, due to lower BP may have had dizziness. - Keep on HCTZ 25mg  daily 2. Encourage improved lifestyle - low sodium diet, regular exercise 3. Monitor BP outside office, bring readings to next visit, if persistently >140/90 or new symptoms notify office sooner

## 2021-02-01 NOTE — Progress Notes (Signed)
Subjective:    Patient ID: Regina Barber, female    DOB: 1960/09/12, 61 y.o.   MRN: [id number]  Regina Barber is a 61 y.o. female presenting on 02/01/2021 for Dizziness   HPI  Hypertension Dizziness episode - resolved Possible Vertigo  Admits on last week Tues she ate a white chocolate almond hershey bar, she said L glandular swelling on jaw/neck and she felt dizzy, she felt fine on Wednesday. Then Thursday AM she noticed she woke up with episodic dizziness episodes, only mild symptoms on Friday but still more functional. Described the dizziness being vertigo or motion sickness related, with some room spinning. - Only recent med change was pharmacy changed manufacturer on benazepril, she says on this for 2 weeks before symptoms  Today she is free of all dizziness and vertigo, asymptomatic. Doing well  BP is in normal range. Checking regularly  On Benazepril 79m (113mx 2 = 2030mdaily On HCTZ 56m52mily  Depression screen PHQ [facility] 01/07/2021 07/06/2020 03/15/2019  Decreased Interest 0 0 0  Down, Depressed, Hopeless 0 0 0  PHQ - 2 Score 0 0 0  Altered sleeping 0 0 -  Tired, decreased energy 0 0 -  Change in appetite 0 0 -  Feeling bad or failure about yourself  0 0 -  Trouble concentrating 0 0 -  Moving slowly or fidgety/restless 0 0 -  Suicidal thoughts 0 0 -  PHQ-9 Score 0 0 -  Difficult doing work/chores Not difficult at all Not difficult at all -    Social History   Tobacco Use   Smoking status: Former    Packs/day: 1.00    Years: 30.00    Pack years: 30.00    Types: Cigarettes    Quit date: 2008    Years since quitting: 15.0   Smokeless tobacco: Never   Tobacco comments:    Quit on own after birth of daughter  Vaping Use   Vaping Use: Never used  Substance Use Topics   Alcohol use: No   Drug use: No    Comment: Former history of substance use    Review of Systems Per HPI unless specifically indicated above     Objective:    BP 109/83    Pulse 86     Ht '5\' 2"'  (1.575 m)    Wt 128 lb 6.4 oz (58.2 kg)    SpO2 100%    BMI 23.48 kg/m   Wt Readings from Last 3 Encounters:  02/01/21 128 lb 6.4 oz (58.2 kg)  01/07/21 133 lb (60.3 kg)  07/06/20 131 lb 3.2 oz (59.5 kg)    Physical Exam Vitals and nursing note reviewed.  Constitutional:      General: She is not in acute distress.    Appearance: Normal appearance. She is well-developed. She is not diaphoretic.     Comments: Well-appearing, comfortable, cooperative  HENT:     Head: Normocephalic and atraumatic.  Eyes:     General:        Right eye: No discharge.        Left eye: No discharge.     Conjunctiva/sclera: Conjunctivae normal.  Cardiovascular:     Rate and Rhythm: Normal rate.  Pulmonary:     Effort: Pulmonary effort is normal.  Skin:    General: Skin is warm and dry.     Findings: No erythema or rash.  Neurological:     Mental Status: She is alert and oriented to person, place, and time.  Psychiatric:        Mood and Affect: Mood normal.        Behavior: Behavior normal.        Thought Content: Thought content normal.     Comments: Well groomed, good eye contact, normal speech and thoughts      Results for orders placed or performed in visit on 01/01/21  TSH  Result Value Ref Range   TSH 1.17 0.40 - 4.50 mIU/L  Hemoglobin A1c  Result Value Ref Range   Hgb A1c MFr Bld 5.8 (H) <5.7 % of total Hgb   Mean Plasma Glucose 120 mg/dL   eAG (mmol/L) 6.6 mmol/L  Lipid panel  Result Value Ref Range   Cholesterol 163 <200 mg/dL   HDL 44 (L) > OR = 50 mg/dL   Triglycerides 74 <150 mg/dL   LDL Cholesterol (Calc) 103 (H) mg/dL (calc)   Total CHOL/HDL Ratio 3.7 <5.0 (calc)   Non-HDL Cholesterol (Calc) 119 <130 mg/dL (calc)  CBC with Differential/Platelet  Result Value Ref Range   WBC 4.9 3.8 - 10.8 Thousand/uL   RBC 4.59 3.80 - 5.10 Million/uL   Hemoglobin 12.5 11.7 - 15.5 g/dL   HCT 38.3 35.0 - 45.0 %   MCV 83.4 80.0 - 100.0 fL   MCH 27.2 27.0 - 33.0 pg   MCHC  32.6 32.0 - 36.0 g/dL   RDW 14.0 11.0 - 15.0 %   Platelets 298 140 - 400 Thousand/uL   MPV 11.3 7.5 - 12.5 fL   Neutro Abs 2,347 1,500 - 7,800 cells/uL   Lymphs Abs 1,852 850 - 3,900 cells/uL   Absolute Monocytes 519 200 - 950 cells/uL   Eosinophils Absolute 162 15 - 500 cells/uL   Basophils Absolute 20 0 - 200 cells/uL   Neutrophils Relative % 47.9 %   Total Lymphocyte 37.8 %   Monocytes Relative 10.6 %   Eosinophils Relative 3.3 %   Basophils Relative 0.4 %  COMPLETE METABOLIC PANEL WITH GFR  Result Value Ref Range   Glucose, Bld 89 65 - 99 mg/dL   BUN 21 7 - 25 mg/dL   Creat 0.99 0.50 - 1.05 mg/dL   eGFR 65 > OR = 60 mL/min/1.63m   BUN/Creatinine Ratio NOT APPLICABLE 6 - 22 (calc)   Sodium 143 135 - 146 mmol/L   Potassium 3.7 3.5 - 5.3 mmol/L   Chloride 106 98 - 110 mmol/L   CO2 27 20 - 32 mmol/L   Calcium 9.4 8.6 - 10.4 mg/dL   Total Protein 7.1 6.1 - 8.1 g/dL   Albumin 4.1 3.6 - 5.1 g/dL   Globulin 3.0 1.9 - 3.7 g/dL (calc)   AG Ratio 1.4 1.0 - 2.5 (calc)   Total Bilirubin 0.3 0.2 - 1.2 mg/dL   Alkaline phosphatase (APISO) 62 37 - 153 U/L   AST 12 10 - 35 U/L   ALT 11 6 - 29 U/L      Assessment & Plan:   Problem List Items Addressed This Visit     Essential hypertension    Well-controlled HTN Note recent change in ACEi manufacturer - Home BP readings none  No known complications   Plan:  1. REDUCE ACEi from Benazepril 154mx 2= 2042maily, now trial ONE pill or 63m31mily, due to lower BP may have had dizziness. - Keep on HCTZ 25mg7mly 2. Encourage improved lifestyle - low sodium diet, regular exercise 3. Monitor BP outside office, bring readings to next visit, if persistently >140/90  or new symptoms notify office sooner      Relevant Medications   benazepril (LOTENSIN) 10 MG tablet   Other Visit Diagnoses     Benign paroxysmal positional vertigo, unspecified laterality    -  Primary      Vertigo/Dizziness - RESOLVED Handout given with Epley  maneuver and advice if returns Will see if resolved when lower BP med as well.   No orders of the defined types were placed in this encounter.     Follow up plan: Return if symptoms worsen or fail to improve.   Nobie Putnam, [facility] 02/01/2021, 11:33 AM

## 2021-03-01 ENCOUNTER — Encounter: Payer: Self-pay | Admitting: Family Medicine

## 2021-03-01 ENCOUNTER — Other Ambulatory Visit: Payer: Self-pay

## 2021-03-01 DIAGNOSIS — J4520 Mild intermittent asthma, uncomplicated: Secondary | ICD-10-CM

## 2021-03-01 DIAGNOSIS — J3089 Other allergic rhinitis: Secondary | ICD-10-CM

## 2021-03-01 MED ORDER — MONTELUKAST SODIUM 10 MG PO TABS: ORAL_TABLET | ORAL | 1 refills | Status: DC

## 2021-06-03 LAB — HM MAMMOGRAPHY

## 2021-07-15 ENCOUNTER — Encounter: Payer: Self-pay | Admitting: Family Medicine

## 2021-07-15 ENCOUNTER — Other Ambulatory Visit: Payer: Self-pay | Admitting: Family Medicine

## 2021-07-15 ENCOUNTER — Ambulatory Visit: Payer: BC Managed Care – PPO | Admitting: Family Medicine

## 2021-07-15 VITALS — BP 119/62 | HR 88 | Ht 62.0 in | Wt 134.2 lb

## 2021-07-15 DIAGNOSIS — E7800 Pure hypercholesterolemia, unspecified: Secondary | ICD-10-CM

## 2021-07-15 DIAGNOSIS — R7303 Prediabetes: Secondary | ICD-10-CM | POA: Diagnosis not present

## 2021-07-15 DIAGNOSIS — I10 Essential (primary) hypertension: Secondary | ICD-10-CM

## 2021-07-15 DIAGNOSIS — Z0000 Encounter for general adult medical examination without abnormal findings: Secondary | ICD-10-CM

## 2021-07-15 LAB — POCT GLYCOSYLATED HEMOGLOBIN (HGB A1C): Hemoglobin A1C: 6.3 % — AB (ref 4.0–5.6)

## 2021-07-15 NOTE — Addendum Note (Signed)
Addended by: Olin Hauser on: 07/15/2021 05:12 PM   Modules accepted: Level of Service

## 2021-07-15 NOTE — Assessment & Plan Note (Signed)
Mild elevated A1c 6.2 to 6.3 now  Plan:  1. Not on any therapy currently  2. Encourage improved lifestyle - low carb, low sugar diet, reduce portion size, continue improving regular exercise 3. Follow-up q 6 mo

## 2021-07-15 NOTE — Progress Notes (Signed)
Subjective:    Patient ID: Regina Barber, female    DOB: 12/14/1960, 61 y.o.   MRN: [id number]  Regina Barber is a 61 y.o. female presenting on 07/15/2021 for Prediabetes   HPI  CHRONIC HTN: Reports no new concerns. Current Meds - Benazepril '10mg'$  daily, HCTZ '25mg'$  daily   Reports good compliance, took meds today. Tolerating well, w/o complaints. Denies CP, dyspnea, HA, edema, dizziness / lightheadedness   Pre-Diabetes Last lab A1c shows improvement 5.8, previously 6.0 to 6.2 range Due for POC A1c today CBGs: Not checking Meds: never on meds Currently on ACEi Taking ASA 81 Lifestyle: - Diet (improved diet regimen) - Exercise (Walking regularly at work) Denies hypoglycemia, polyuria, visual changes, numbness or tingling.   Hyperlipidemia Previous LDL controlled. Next due 12/2021    Health Maintenance: Declines Shingrix. COVID may consider updated booster Declines Flu Shot Mammogram completed 05/2021       01/07/2021    8:49 AM 07/06/2020    8:21 AM 03/15/2019    3:56 PM  Depression screen PHQ 2/9  Decreased Interest 0 0 0  Down, Depressed, Hopeless 0 0 0  PHQ - 2 Score 0 0 0  Altered sleeping 0 0   Tired, decreased energy 0 0   Change in appetite 0 0   Feeling bad or failure about yourself  0 0   Trouble concentrating 0 0   Moving slowly or fidgety/restless 0 0   Suicidal thoughts 0 0   PHQ-9 Score 0 0   Difficult doing work/chores Not difficult at all Not difficult at all     Social History   Tobacco Use   Smoking status: Former    Packs/day: 1.00    Years: 30.00    Total pack years: 30.00    Types: Cigarettes    Quit date: 2008    Years since quitting: 15.5   Smokeless tobacco: Never   Tobacco comments:    Quit on own after birth of daughter  Vaping Use   Vaping Use: Never used  Substance Use Topics   Alcohol use: No   Drug use: No    Comment: Former history of substance use    Review of Systems Per HPI unless specifically indicated  above     Objective:    BP 119/62   Pulse 88   Ht '5\' 2"'$  (1.575 m)   Wt 134 lb 3.2 oz (60.9 kg)   SpO2 100%   BMI 24.55 kg/m   Wt Readings from Last 3 Encounters:  07/15/21 134 lb 3.2 oz (60.9 kg)  02/01/21 128 lb 6.4 oz (58.2 kg)  01/07/21 133 lb (60.3 kg)    Physical Exam Vitals and nursing note reviewed.  Constitutional:      General: She is not in acute distress.    Appearance: Normal appearance. She is well-developed. She is not diaphoretic.     Comments: Well-appearing, comfortable, cooperative  HENT:     Head: Normocephalic and atraumatic.  Eyes:     General:        Right eye: No discharge.        Left eye: No discharge.     Conjunctiva/sclera: Conjunctivae normal.  Cardiovascular:     Rate and Rhythm: Normal rate.  Pulmonary:     Effort: Pulmonary effort is normal.  Skin:    General: Skin is warm and dry.     Findings: No erythema or rash.  Neurological:     Mental Status: She is alert and oriented  to person, place, and time.  Psychiatric:        Mood and Affect: Mood normal.        Behavior: Behavior normal.        Thought Content: Thought content normal.     Comments: Well groomed, good eye contact, normal speech and thoughts    Results for orders placed or performed in visit on 07/15/21  POCT HgB A1C  Result Value Ref Range   Hemoglobin A1C 6.3 (A) 4.0 - 5.6 %      Assessment & Plan:   Problem List Items Addressed This Visit     Pre-diabetes - Primary    Mild elevated A1c 6.2 to 6.3 now  Plan:  1. Not on any therapy currently  2. Encourage improved lifestyle - low carb, low sugar diet, reduce portion size, continue improving regular exercise 3. Follow-up q 6 mo       Relevant Orders   POCT HgB A1C (Completed)   Hyperlipidemia    Controlled cholesterol on lifestyle The 10-year ASCVD risk score (Arnett DK, et al., 2019) is: 5.5%  Plan: 1. Not indicated for statin 2. Continue ASA '81mg'$  for primary ASCVD risk reduction 3. Encourage  improved lifestyle - low carb/cholesterol, reduce portion size, continue improving regular exercise      Essential hypertension    Well-controlled HTN Home BP readings none  No known complications   Plan:  1. Benazepril '10mg'$  daily, HCTZ '25mg'$  daily 2. Encourage improved lifestyle - low sodium diet, regular exercise 3. Monitor BP outside office, bring readings to next visit, if persistently >140/90 or new symptoms notify office sooner       No orders of the defined types were placed in this encounter.     Follow up plan: Return in about 6 months (around 01/14/2022) for 6 month fasting lab only then 1 week later Annual Physical.  Future labs ordered for 01/06/22   Nobie Putnam, [facility] 07/15/2021, 8:12 AM

## 2021-07-15 NOTE — Patient Instructions (Addendum)
Thank you for coming to the office today.  Recent Labs    01/04/21 0814 07/15/21 0815  HGBA1C 5.8* 6.3*   A1c mild elevation, previously 6.0 to 6.2, I am confident you can keep it on the correct track.  BP is excellent. Weight steady.   DUE for FASTING BLOOD WORK (no food or drink after midnight before the lab appointment, only water or coffee without cream/sugar on the morning of)  SCHEDULE "Lab Only" visit in the morning at the clinic for lab draw in 6 MONTHS   - Make sure Lab Only appointment is at about 1 week before your next appointment, so that results will be available  For Lab Results, once available within 2-3 days of blood draw, you can can log in to MyChart online to view your results and a brief explanation. Also, we can discuss results at next follow-up visit.   Please schedule a Follow-up Appointment to: Return in about 6 months (around 01/14/2022) for 6 month fasting lab only then 1 week later Annual Physical.  If you have any other questions or concerns, please feel free to call the office or send a message through [facility]. You may also schedule an earlier appointment if necessary.  Additionally, you may be receiving a survey about your experience at our office within a few days to 1 week by e-mail or mail. We value your feedback.  Nobie Putnam, DO [place]

## 2021-07-15 NOTE — Assessment & Plan Note (Signed)
Well-controlled HTN Home BP readings none  No known complications   Plan:  1. Benazepril 10mg daily, HCTZ 25mg daily 2. Encourage improved lifestyle - low sodium diet, regular exercise 3. Monitor BP outside office, bring readings to next visit, if persistently >140/90 or new symptoms notify office sooner 

## 2021-07-15 NOTE — Assessment & Plan Note (Signed)
Controlled cholesterol on lifestyle The 10-year ASCVD risk score (Arnett DK, et al., 2019) is: 5.5%  Plan: 1. Not indicated for statin 2. Continue ASA '81mg'$  for primary ASCVD risk reduction 3. Encourage improved lifestyle - low carb/cholesterol, reduce portion size, continue improving regular exercise

## 2021-09-06 ENCOUNTER — Ambulatory Visit: Payer: Self-pay | Admitting: *Deleted

## 2021-09-06 NOTE — Telephone Encounter (Signed)
   Patient called having dizziness, started Saturday night, when she lays down and starts get up

## 2021-09-06 NOTE — Telephone Encounter (Signed)
  Chief Complaint: lightheadedness and vertigo Symptoms: sx upon bending over, diarrhea Frequency: Sat am Pertinent Negatives: Patient denies weakness,  Disposition: '[]'$ ED /'[]'$ Urgent Care (no appt availability in office) / '[x]'$ Appointment(In office/virtual)/ '[]'$  [facility]/ '[]'$ Home Care/ '[]'$ Refused Recommended Disposition /'[]'$ [place] [facility]/ '[]'$  Follow-up with PCP Additional Notes: apt Wednesday- placed on waitlist Reason for Disposition  [1] MILD dizziness (e.g., walking normally) AND [2] has been evaluated by doctor (or NP/PA) for this  Answer Assessment - Initial Assessment Questions 1. DESCRIPTION: "Describe your dizziness."     lightheaded 2. LIGHTHEADED: "Do you feel lightheaded?" (e.g., somewhat faint, woozy, weak upon standing)     yes 3. VERTIGO: "Do you feel like either you or the room is spinning or tilting?" (i.e. vertigo)     yes 4. SEVERITY: "How bad is it?"  "Do you feel like you are going to faint?" "Can you stand and walk?"   - MILD: Feels slightly dizzy, but walking normally.   - MODERATE: Feels unsteady when walking, but not falling; interferes with normal activities (e.g., school, work).   - SEVERE: Unable to walk without falling, or requires assistance to walk without falling; feels like passing out now.      mild 5. ONSET:  "When did the dizziness begin?"     Sat am 6. AGGRAVATING FACTORS: "Does anything make it worse?" (e.g., standing, change in head position)     Bend over, walk too fast 7. HEART RATE: "Can you tell me your heart rate?" "How many beats in 15 seconds?"  (Note: not all patients can do this)       unable 8. CAUSE: "What do you think is causing the dizziness?"     unsure 9. RECURRENT SYMPTOM: "Have you had dizziness before?" If Yes, ask: "When was the last time?" "What happened that time?"     Yes- vertigo-self resolved 10. OTHER SYMPTOMS: "Do you have any other symptoms?" (e.g., fever, chest pain, vomiting, diarrhea, bleeding)        diarrhea 11. PREGNANCY: "Is there any chance you are pregnant?" "When was your last menstrual period?"       N/a  Protocols used: Dizziness - Lightheadedness-A-AH

## 2021-09-08 ENCOUNTER — Ambulatory Visit: Payer: BC Managed Care – PPO | Admitting: Physician Assistant

## 2021-09-08 ENCOUNTER — Encounter: Payer: Self-pay | Admitting: Physician Assistant

## 2021-09-08 VITALS — BP 128/78 | HR 76 | Ht 62.0 in | Wt 134.0 lb

## 2021-09-08 DIAGNOSIS — R42 Dizziness and giddiness: Secondary | ICD-10-CM | POA: Diagnosis not present

## 2021-09-08 NOTE — Progress Notes (Signed)
Acute Office Visit   Patient: Regina Barber   DOB: [birth year]   61 y.o. Female  MRN: [id number] Visit Date: 09/08/2021  Today's healthcare provider: Dani Gobble Marlee Trentman, PA-C  Introduced myself to the patient as a Journalist, newspaper and provided education on APPs in clinical practice.    Chief Complaint  Patient presents with   Dizziness   Subjective    Dizziness Pertinent negatives include no chills, coughing, fever, headaches, nausea, sore throat or vomiting.     Dizziness States it started on Sat and seemed to be gradually improving through yesterday Felt like she was spinning  Reports on Sunday she had some blurry vision but this has resolved  She has had this before - was told it sounded like vertigo  Interventions: Tylenol    Medications: Outpatient Medications Prior to Visit  Medication Sig   albuterol (VENTOLIN HFA) 108 (90 Base) MCG/ACT inhaler Inhale 1-2 puffs into the lungs every 4 (four) hours as needed for wheezing or shortness of breath.   aspirin EC 81 MG tablet Take 81 mg by mouth daily.   benazepril (LOTENSIN) 10 MG tablet Take 1 tablet (10 mg total) by mouth daily.   cetirizine (ZYRTEC) 10 MG tablet Take 1 tablet (10 mg total) by mouth daily.   hydrochlorothiazide (HYDRODIURIL) 25 MG tablet Take 1 tablet (25 mg total) by mouth daily.   montelukast (SINGULAIR) 10 MG tablet TAKE 1 TABLET BY MOUTH EVERYDAY AT BEDTIME   No facility-administered medications prior to visit.    Review of Systems  Constitutional:  Negative for chills and fever.  HENT:  Negative for ear pain and sore throat.   Eyes:  Negative for discharge, itching and visual disturbance.  Respiratory:  Negative for cough, shortness of breath and wheezing.   Gastrointestinal:  Positive for diarrhea. Negative for nausea and vomiting.  Neurological:  Positive for dizziness. Negative for syncope and headaches.       Objective    BP 128/78   Pulse 76   Ht '5\' 2"'$  (1.575 m)   Wt 134 lb (60.8 kg)    SpO2 100%   BMI 24.51 kg/m    Physical Exam Vitals reviewed.  Constitutional:      General: She is awake.     Appearance: Normal appearance. She is well-developed, well-groomed and normal weight.  HENT:     Head: Normocephalic and atraumatic.     Right Ear: Tympanic membrane, ear canal and external ear normal.     Left Ear: Tympanic membrane, ear canal and external ear normal.     Mouth/Throat:     Lips: Pink.     Pharynx: Oropharynx is clear. Uvula midline. No pharyngeal swelling, oropharyngeal exudate, posterior oropharyngeal erythema or uvula swelling.  Lymphadenopathy:     Head:     Right side of head: No submental or submandibular adenopathy.     Left side of head: No submental or submandibular adenopathy.     Upper Body:     Right upper body: No supraclavicular adenopathy.     Left upper body: No supraclavicular adenopathy.  Neurological:     Mental Status: She is alert.  Psychiatric:        Attention and Perception: Attention normal.        Mood and Affect: Mood normal.        Speech: Speech normal.        Behavior: Behavior normal. Behavior is cooperative.  No results found for any visits on 09/08/21.  Assessment & Plan      No follow-ups on file.        Problem List Items Addressed This Visit   None Visit Diagnoses     Episode of dizziness    -  Primary Acute, resolved at time of apt Reports ongoing dizziness over the weekend that has resolved at time of apt Reviewed inner ear anatomy and potential causes of dizziness Provided instructions for Epley maneuver for her to do at home as needed Can use antihistamines and Flonase if desired to relieve dizziness from sinus congestion  Recommend dramamine or motion sickness patches for associated nausea PRN Follow up as needed         No follow-ups on file.   I, Arnell Slivinski E Breylin Dom, PA-C, have reviewed all documentation for this visit. The documentation on 09/08/21 for the exam, diagnosis, procedures,  and orders are all accurate and complete.   Talitha Givens, MHS, PA-C [facility]

## 2021-09-08 NOTE — Patient Instructions (Addendum)
You can do the included maneuver to assist with dizziness   You can also use Flonase if you are having congestion and sinus pressure while having dizziness as this can help reduce those symptoms  If your symptoms become very severe and unmanageable, or are associated with nausea and vomiting I recommend using Dramamine or sea sickness patches, per manufacturer's instructions to help with those symptoms  Let us know if you have other questions or concerns

## 2021-12-06 ENCOUNTER — Other Ambulatory Visit: Payer: Self-pay | Admitting: Family Medicine

## 2021-12-06 DIAGNOSIS — J3089 Other allergic rhinitis: Secondary | ICD-10-CM

## 2021-12-06 DIAGNOSIS — J4520 Mild intermittent asthma, uncomplicated: Secondary | ICD-10-CM

## 2021-12-06 NOTE — Telephone Encounter (Signed)
Requested Prescriptions  Pending Prescriptions Disp Refills   montelukast (SINGULAIR) 10 MG tablet [Pharmacy Med Name: MONTELUKAST SOD 10 MG TABLET] 90 tablet 1    Sig: TAKE 1 TABLET BY MOUTH EVERYDAY AT BEDTIME     Pulmonology:  Leukotriene Inhibitors Passed - 12/06/2021  2:00 AM      Passed - Valid encounter within last 12 months    Recent Outpatient Visits           2 months ago Episode of dizziness   [place], [place]   4 months ago Pre-diabetes   [place], [place]   10 months ago Benign paroxysmal positional vertigo, unspecified laterality   [facility] Olin Hauser, DO   11 months ago Annual physical exam   [facility] Olin Hauser, DO   1 year ago [facility] Parks Ranger, Devonne Doughty, DO       Future Appointments             In 1 month Parks Ranger, Devonne Doughty, [facility], [facility]

## 2022-01-05 ENCOUNTER — Other Ambulatory Visit: Payer: Self-pay

## 2022-01-05 DIAGNOSIS — E7800 Pure hypercholesterolemia, unspecified: Secondary | ICD-10-CM

## 2022-01-05 DIAGNOSIS — R7303 Prediabetes: Secondary | ICD-10-CM

## 2022-01-05 DIAGNOSIS — Z0000 Encounter for general adult medical examination without abnormal findings: Secondary | ICD-10-CM

## 2022-01-05 DIAGNOSIS — I10 Essential (primary) hypertension: Secondary | ICD-10-CM

## 2022-01-06 ENCOUNTER — Other Ambulatory Visit: Payer: BC Managed Care – PPO

## 2022-01-07 LAB — COMPLETE METABOLIC PANEL WITH GFR
AG Ratio: 1.5 (calc) (ref 1.0–2.5)
ALT: 12 U/L (ref 6–29)
AST: 14 U/L (ref 10–35)
Albumin: 4.3 g/dL (ref 3.6–5.1)
Alkaline phosphatase (APISO): 70 U/L (ref 37–153)
BUN/Creatinine Ratio: 17 (calc) (ref 6–22)
BUN: 19 mg/dL (ref 7–25)
CO2: 30 mmol/L (ref 20–32)
Calcium: 9.8 mg/dL (ref 8.6–10.4)
Chloride: 105 mmol/L (ref 98–110)
Creat: 1.1 mg/dL — ABNORMAL HIGH (ref 0.50–1.05)
Globulin: 2.9 g/dL (calc) (ref 1.9–3.7)
Glucose, Bld: 99 mg/dL (ref 65–99)
Potassium: 3.9 mmol/L (ref 3.5–5.3)
Sodium: 142 mmol/L (ref 135–146)
Total Bilirubin: 0.3 mg/dL (ref 0.2–1.2)
Total Protein: 7.2 g/dL (ref 6.1–8.1)
eGFR: 57 mL/min/{1.73_m2} — ABNORMAL LOW (ref >=60)

## 2022-01-07 LAB — CBC WITH DIFFERENTIAL/PLATELET
Absolute Monocytes: 519 cells/uL (ref 200–950)
Basophils Absolute: 32 cells/uL (ref 0–200)
Basophils Relative: 0.6 %
Eosinophils Absolute: 159 cells/uL (ref 15–500)
Eosinophils Relative: 3 %
HCT: 38.3 % (ref 35.0–45.0)
Hemoglobin: 12.6 g/dL (ref 11.7–15.5)
Lymphs Abs: 1717 cells/uL (ref 850–3900)
MCH: 27 pg (ref 27.0–33.0)
MCHC: 32.9 g/dL (ref 32.0–36.0)
MCV: 82 fL (ref 80.0–100.0)
MPV: 11.6 fL (ref 7.5–12.5)
Monocytes Relative: 9.8 %
Neutro Abs: 2873 cells/uL (ref 1500–7800)
Neutrophils Relative %: 54.2 %
Platelets: 291 10*3/uL (ref 140–400)
RBC: 4.67 10*6/uL (ref 3.80–5.10)
RDW: 14.3 % (ref 11.0–15.0)
Total Lymphocyte: 32.4 %
WBC: 5.3 10*3/uL (ref 3.8–10.8)

## 2022-01-07 LAB — TSH: TSH: 1.45 mIU/L (ref 0.40–4.50)

## 2022-01-07 LAB — LIPID PANEL
Cholesterol: 178 mg/dL (ref <200)
HDL: 42 mg/dL — ABNORMAL LOW (ref >=50)
LDL Cholesterol (Calc): 114 mg/dL (calc) — ABNORMAL HIGH
Non-HDL Cholesterol (Calc): 136 mg/dL (calc) — ABNORMAL HIGH (ref <130)
Total CHOL/HDL Ratio: 4.2 (calc) (ref <5.0)
Triglycerides: 109 mg/dL (ref <150)

## 2022-01-07 LAB — HEMOGLOBIN A1C
Hgb A1c MFr Bld: 6.1 % of total Hgb — ABNORMAL HIGH (ref <5.7)
Mean Plasma Glucose: 128 mg/dL
eAG (mmol/L): 7.1 mmol/L

## 2022-01-12 ENCOUNTER — Encounter: Payer: BC Managed Care – PPO | Admitting: Family Medicine

## 2022-01-28 ENCOUNTER — Encounter: Payer: Self-pay | Admitting: Family Medicine

## 2022-01-28 ENCOUNTER — Ambulatory Visit (INDEPENDENT_AMBULATORY_CARE_PROVIDER_SITE_OTHER): Payer: BC Managed Care – PPO | Admitting: Family Medicine

## 2022-01-28 VITALS — BP 118/70 | HR 83 | Ht 62.0 in | Wt 133.6 lb

## 2022-01-28 DIAGNOSIS — J4520 Mild intermittent asthma, uncomplicated: Secondary | ICD-10-CM

## 2022-01-28 DIAGNOSIS — I10 Essential (primary) hypertension: Secondary | ICD-10-CM

## 2022-01-28 DIAGNOSIS — E7800 Pure hypercholesterolemia, unspecified: Secondary | ICD-10-CM | POA: Diagnosis not present

## 2022-01-28 DIAGNOSIS — Z0000 Encounter for general adult medical examination without abnormal findings: Secondary | ICD-10-CM | POA: Diagnosis not present

## 2022-01-28 DIAGNOSIS — R7303 Prediabetes: Secondary | ICD-10-CM | POA: Diagnosis not present

## 2022-01-28 MED ORDER — HYDROCHLOROTHIAZIDE 25 MG PO TABS: 25.0000 mg | ORAL_TABLET | Freq: Every day | ORAL | 3 refills | Status: DC

## 2022-01-28 MED ORDER — BENAZEPRIL HCL 10 MG PO TABS: 10.0000 mg | ORAL_TABLET | Freq: Every day | ORAL | 3 refills | Status: DC

## 2022-01-28 NOTE — Assessment & Plan Note (Signed)
Controlled cholesterol on lifestyle The 10-year ASCVD risk score (Arnett DK, et al., 2019) is: 6%  Plan: 1. Not indicated for statin 2. Continue ASA '81mg'$  for primary ASCVD risk reduction 3. Encourage improved lifestyle - low carb/cholesterol, reduce portion size, continue improving regular exercise

## 2022-01-28 NOTE — Patient Instructions (Addendum)
Thank you for coming to the office today.  Refills, keep on current BP medications.  Lab results look excellent  Next visit is check fingerstick A1c  Please schedule a Follow-up Appointment to: Return in about 6 months (around 07/29/2022) for 6 month PreDM A1c HTN.  If you have any other questions or concerns, please feel free to call the office or send a message through [facility]. You may also schedule an earlier appointment if necessary.  Additionally, you may be receiving a survey about your experience at our office within a few days to 1 week by e-mail or mail. We value your feedback.  Nobie Putnam, DO [place]

## 2022-01-28 NOTE — Progress Notes (Signed)
Subjective:    Patient ID: Regina Barber, female    DOB: 08-26-1960, 62 y.o.   MRN: [id number]  Regina Barber is a 62 y.o. female presenting on 01/28/2022 for Annual Exam   HPI  Here for Annual Physical and Lab Review  CHRONIC HTN: Reports no new concerns. Current Meds - Benazepril '10mg'$  daily, HCTZ '25mg'$  daily   Reports good compliance, took meds today. Tolerating well, w/o complaints. Denies CP, dyspnea, HA, edema, dizziness / lightheadedness   Pre-Diabetes Improved A1c 6.1, prior 6.2 to 6.3 CBGs: Not checking Meds: never on meds Currently on ACEi Taking ASA 81 Lifestyle: - Diet (improved diet regimen) - Exercise (Walking regularly at work) Denies hypoglycemia, polyuria, visual changes, numbness or tingling.   Hyperlipidemia Controlled lipid panel. Not indicated for rx Statin therapy.     Health Maintenance: Declines Shingrix. Declines Flu Shot  Colonoscopy completed 2018, next due 2028  Mammogram completed 05/2021, next due 1 yr 05/2022 - at [facility]     01/07/2021    8:49 AM 07/06/2020    8:21 AM 03/15/2019    3:56 PM  Depression screen PHQ 2/9  Decreased Interest 0 0 0  Down, Depressed, Hopeless 0 0 0  PHQ - 2 Score 0 0 0  Altered sleeping 0 0   Tired, decreased energy 0 0   Change in appetite 0 0   Feeling bad or failure about yourself  0 0   Trouble concentrating 0 0   Moving slowly or fidgety/restless 0 0   Suicidal thoughts 0 0   PHQ-9 Score 0 0   Difficult doing work/chores Not difficult at all Not difficult at all     Past Medical History:  Diagnosis Date   Allergy    Asthma    Genital warts    Hypertension    Past Surgical History:  Procedure Laterality Date   ABDOMINAL HYSTERECTOMY  1987   Bilateral ovaries spared, reportedly cervix removed.   COLONOSCOPY WITH PROPOFOL N/A 06/27/2016   Procedure: COLONOSCOPY WITH PROPOFOL;  Surgeon: Lucilla Lame, MD;  Location: [place];  Service: Endoscopy;  Laterality: N/A;   POLYPECTOMY   06/27/2016   Procedure: POLYPECTOMY;  Surgeon: Lucilla Lame, MD;  Location: [facility];  Service: Endoscopy;;   Social History   Socioeconomic History   Marital status: Married    Spouse name: Not on file   Number of children: 2   Years of education: Associate's Degree   Highest education level: Not on file  Occupational History   Occupation: School Custodian  Tobacco Use   Smoking status: Former    Packs/day: 1.00    Years: 30.00    Total pack years: 30.00    Types: Cigarettes    Quit date: 2008    Years since quitting: 16.0   Smokeless tobacco: Never   Tobacco comments:    Quit on own after birth of daughter  Vaping Use   Vaping Use: Never used  Substance and Sexual Activity   Alcohol use: No   Drug use: No    Comment: Former history of substance use   Sexual activity: Not on file  Other Topics Concern   Not on file  Social History Narrative   Not on file   Social Determinants of Health   Financial Resource Strain: Not on file  Food Insecurity: Not on file  Transportation Needs: Not on file  Physical Activity: Not on file  Stress: Not on file  Social Connections: Not on file  Intimate Partner Violence: Not on file   Family History  Problem Relation Age of Onset   Cirrhosis Father 45   Current Outpatient Medications on File Prior to Visit  Medication Sig   albuterol (VENTOLIN HFA) 108 (90 Base) MCG/ACT inhaler Inhale 1-2 puffs into the lungs every 4 (four) hours as needed for wheezing or shortness of breath.   aspirin EC 81 MG tablet Take 81 mg by mouth daily.   cetirizine (ZYRTEC) 10 MG tablet Take 1 tablet (10 mg total) by mouth daily.   montelukast (SINGULAIR) 10 MG tablet TAKE 1 TABLET BY MOUTH EVERYDAY AT BEDTIME   No current facility-administered medications on file prior to visit.    Review of Systems  Constitutional:  Negative for activity change, appetite change, chills, diaphoresis, fatigue and fever.  HENT:  Negative for congestion  and hearing loss.   Eyes:  Negative for visual disturbance.  Respiratory:  Negative for cough, chest tightness, shortness of breath and wheezing.   Cardiovascular:  Negative for chest pain, palpitations and leg swelling.  Gastrointestinal:  Negative for abdominal pain, constipation, diarrhea, nausea and vomiting.  Genitourinary:  Negative for dysuria, frequency and hematuria.  Musculoskeletal:  Negative for arthralgias and neck pain.  Skin:  Negative for rash.  Neurological:  Negative for dizziness, weakness, light-headedness, numbness and headaches.  Hematological:  Negative for adenopathy.  Psychiatric/Behavioral:  Negative for behavioral problems, dysphoric mood and sleep disturbance.    Per HPI unless specifically indicated above      Objective:    BP 118/70   Pulse 83   Ht '5\' 2"'$  (1.575 m)   Wt 133 lb 9.6 oz (60.6 kg)   SpO2 98%   BMI 24.44 kg/m   Wt Readings from Last 3 Encounters:  01/28/22 133 lb 9.6 oz (60.6 kg)  09/08/21 134 lb (60.8 kg)  07/15/21 134 lb 3.2 oz (60.9 kg)    Physical Exam Vitals and nursing note reviewed.  Constitutional:      General: She is not in acute distress.    Appearance: She is well-developed. She is not diaphoretic.     Comments: Well-appearing, comfortable, cooperative  HENT:     Head: Normocephalic and atraumatic.  Eyes:     General:        Right eye: No discharge.        Left eye: No discharge.     Conjunctiva/sclera: Conjunctivae normal.     Pupils: Pupils are equal, round, and reactive to light.  Neck:     Thyroid: No thyromegaly.  Cardiovascular:     Rate and Rhythm: Normal rate and regular rhythm.     Pulses: Normal pulses.     Heart sounds: Normal heart sounds. No murmur heard. Pulmonary:     Effort: Pulmonary effort is normal. No respiratory distress.     Breath sounds: Normal breath sounds. No wheezing or rales.  Abdominal:     General: Bowel sounds are normal. There is no distension.     Palpations: Abdomen is soft.  There is no mass.     Tenderness: There is no abdominal tenderness.  Musculoskeletal:        General: No tenderness. Normal range of motion.     Cervical back: Normal range of motion and neck supple.     Comments: Upper / Lower Extremities: - Normal muscle tone, strength bilateral upper extremities 5/5, lower extremities 5/5  Lymphadenopathy:     Cervical: No cervical adenopathy.  Skin:    General: Skin is  warm and dry.     Findings: No erythema or rash.  Neurological:     Mental Status: She is alert and oriented to person, place, and time.     Comments: Distal sensation intact to light touch all extremities  Psychiatric:        Mood and Affect: Mood normal.        Behavior: Behavior normal.        Thought Content: Thought content normal.     Comments: Well groomed, good eye contact, normal speech and thoughts    Results for orders placed or performed in visit on 01/05/22  TSH  Result Value Ref Range   TSH 1.45 0.40 - 4.50 mIU/L  Hemoglobin A1c  Result Value Ref Range   Hgb A1c MFr Bld 6.1 (H) <5.7 % of total Hgb   Mean Plasma Glucose 128 mg/dL   eAG (mmol/L) 7.1 mmol/L  Lipid panel  Result Value Ref Range   Cholesterol 178 <200 mg/dL   HDL 42 (L) > OR = 50 mg/dL   Triglycerides 109 <150 mg/dL   LDL Cholesterol (Calc) 114 (H) mg/dL (calc)   Total CHOL/HDL Ratio 4.2 <5.0 (calc)   Non-HDL Cholesterol (Calc) 136 (H) <130 mg/dL (calc)  CBC with Differential/Platelet  Result Value Ref Range   WBC 5.3 3.8 - 10.8 Thousand/uL   RBC 4.67 3.80 - 5.10 Million/uL   Hemoglobin 12.6 11.7 - 15.5 g/dL   HCT 38.3 35.0 - 45.0 %   MCV 82.0 80.0 - 100.0 fL   MCH 27.0 27.0 - 33.0 pg   MCHC 32.9 32.0 - 36.0 g/dL   RDW 14.3 11.0 - 15.0 %   Platelets 291 140 - 400 Thousand/uL   MPV 11.6 7.5 - 12.5 fL   Neutro Abs 2,873 1,500 - 7,800 cells/uL   Lymphs Abs 1,717 850 - 3,900 cells/uL   Absolute Monocytes 519 200 - 950 cells/uL   Eosinophils Absolute 159 15 - 500 cells/uL   Basophils  Absolute 32 0 - 200 cells/uL   Neutrophils Relative % 54.2 %   Total Lymphocyte 32.4 %   Monocytes Relative 9.8 %   Eosinophils Relative 3.0 %   Basophils Relative 0.6 %  COMPLETE METABOLIC PANEL WITH GFR  Result Value Ref Range   Glucose, Bld 99 65 - 99 mg/dL   BUN 19 7 - 25 mg/dL   Creat 1.10 (H) 0.50 - 1.05 mg/dL   eGFR 57 (L) > OR = 60 mL/min/1.29m   BUN/Creatinine Ratio 17 6 - 22 (calc)   Sodium 142 135 - 146 mmol/L   Potassium 3.9 3.5 - 5.3 mmol/L   Chloride 105 98 - 110 mmol/L   CO2 30 20 - 32 mmol/L   Calcium 9.8 8.6 - 10.4 mg/dL   Total Protein 7.2 6.1 - 8.1 g/dL   Albumin 4.3 3.6 - 5.1 g/dL   Globulin 2.9 1.9 - 3.7 g/dL (calc)   AG Ratio 1.5 1.0 - 2.5 (calc)   Total Bilirubin 0.3 0.2 - 1.2 mg/dL   Alkaline phosphatase (APISO) 70 37 - 153 U/L   AST 14 10 - 35 U/L   ALT 12 6 - 29 U/L      Assessment & Plan:   Problem List Items Addressed This Visit     Asthma    Stable, without flare Use Albuterol AS NEEDED flare      Essential hypertension    Well-controlled HTN Home BP readings none  No known complications   Plan:  1. Benazepril '10mg'$  daily, HCTZ '25mg'$  daily 2. Encourage improved lifestyle - low sodium diet, regular exercise 3. Monitor BP outside office, bring readings to next visit, if persistently >140/90 or new symptoms notify office sooner      Relevant Medications   benazepril (LOTENSIN) 10 MG tablet   hydrochlorothiazide (HYDRODIURIL) 25 MG tablet   Hyperlipidemia    Controlled cholesterol on lifestyle The 10-year ASCVD risk score (Arnett DK, et al., 2019) is: 6%  Plan: 1. Not indicated for statin 2. Continue ASA '81mg'$  for primary ASCVD risk reduction 3. Encourage improved lifestyle - low carb/cholesterol, reduce portion size, continue improving regular exercise      Relevant Medications   benazepril (LOTENSIN) 10 MG tablet   hydrochlorothiazide (HYDRODIURIL) 25 MG tablet   Pre-diabetes    Mild elevated A1c 6.1 improved now  Plan:  1.  Not on any therapy currently  2. Encourage improved lifestyle - low carb, low sugar diet, reduce portion size, continue improving regular exercise 3. Follow-up q 6 mo       Other Visit Diagnoses     Annual physical exam    -  Primary       Updated Health Maintenance information Reviewed recent lab results with patient Encouraged improvement to lifestyle with diet and exercise Goal of weight loss     Meds ordered this encounter  Medications   benazepril (LOTENSIN) 10 MG tablet    Sig: Take 1 tablet (10 mg total) by mouth daily.    Dispense:  90 tablet    Refill:  3    Updated, add refills   hydrochlorothiazide (HYDRODIURIL) 25 MG tablet    Sig: Take 1 tablet (25 mg total) by mouth daily.    Dispense:  90 tablet    Refill:  3    Add refills on file, and update to 90 day    Follow up plan: Return in about 6 months (around 07/29/2022) for 6 month PreDM A1c HTN.  Nobie Putnam, [facility] 01/28/2022, 4:12 PM

## 2022-01-28 NOTE — Assessment & Plan Note (Signed)
Mild elevated A1c 6.1 improved now  Plan:  1. Not on any therapy currently  2. Encourage improved lifestyle - low carb, low sugar diet, reduce portion size, continue improving regular exercise 3. Follow-up q 6 mo

## 2022-01-28 NOTE — Assessment & Plan Note (Signed)
Stable, without flare Use Albuterol AS NEEDED flare

## 2022-01-28 NOTE — Assessment & Plan Note (Signed)
Well-controlled HTN Home BP readings none  No known complications   Plan:  1. Benazepril '10mg'$  daily, HCTZ '25mg'$  daily 2. Encourage improved lifestyle - low sodium diet, regular exercise 3. Monitor BP outside office, bring readings to next visit, if persistently >140/90 or new symptoms notify office sooner

## 2022-03-22 ENCOUNTER — Other Ambulatory Visit: Payer: Self-pay | Admitting: Family Medicine

## 2022-03-22 DIAGNOSIS — I10 Essential (primary) hypertension: Secondary | ICD-10-CM

## 2022-08-01 ENCOUNTER — Other Ambulatory Visit: Payer: Self-pay | Admitting: Family Medicine

## 2022-08-01 DIAGNOSIS — J4520 Mild intermittent asthma, uncomplicated: Secondary | ICD-10-CM

## 2022-08-02 MED ORDER — ALBUTEROL SULFATE HFA 108 (90 BASE) MCG/ACT IN AERS: 1.0000 | INHALATION_SPRAY | RESPIRATORY_TRACT | 2 refills | Status: DC | PRN

## 2022-08-02 NOTE — Telephone Encounter (Signed)
Requested medication (s) are due for refill today: yes  Requested medication (s) are on the active medication list: yes   Last refill:  last reordered 01/07/21 8.5 each 2 RF  Future visit scheduled: yes   Notes to clinic:  last prescribed in 2022-    Requested Prescriptions  Pending Prescriptions Disp Refills   albuterol (VENTOLIN HFA) 108 (90 Base) MCG/ACT inhaler [Pharmacy Med Name: ALBUTEROL HFA (PROAIR) INHALER] 8.5 each 2    Sig: INHALE 1-2 PUFFS INTO THE LUNGS EVERY 4 HOURS AS NEEDED FOR WHEEZING OR SHORTNESS OF BREATH.     Pulmonology:  Beta Agonists 2 Passed - 08/01/2022 11:45 AM      Passed - Last BP in normal range    BP Readings from Last 1 Encounters:  01/28/22 118/70         Passed - Last Heart Rate in normal range    Pulse Readings from Last 1 Encounters:  01/28/22 83         Passed - Valid encounter within last 12 months    Recent Outpatient Visits           6 months ago Annual physical exam   [facility] [place], Netta Neat, DO   10 months ago Episode of dizziness   [place] [facility] [place], Oswaldo Conroy, [place]   1 year ago Pre-diabetes   [place] [facility] Smitty Cords, DO   1 year ago Benign paroxysmal positional vertigo, unspecified laterality   [place] [facility] Smitty Cords, DO   1 year ago Annual physical exam   [place] [facility] [place], Netta Neat, [place]

## 2022-08-02 NOTE — Addendum Note (Signed)
Addended by: Smitty Cords on: 08/02/2022 06:40 PM   Modules accepted: Orders

## 2022-08-05 ENCOUNTER — Ambulatory Visit (INDEPENDENT_AMBULATORY_CARE_PROVIDER_SITE_OTHER): Payer: BC Managed Care – PPO | Admitting: Internal Medicine

## 2022-08-05 ENCOUNTER — Ambulatory Visit: Payer: Self-pay

## 2022-08-05 VITALS — BP 100/58 | HR 76 | Ht 62.0 in | Wt 132.0 lb

## 2022-08-05 DIAGNOSIS — R42 Dizziness and giddiness: Secondary | ICD-10-CM

## 2022-08-05 MED ORDER — MECLIZINE HCL 25 MG PO TABS: 25.0000 mg | ORAL_TABLET | Freq: Three times a day (TID) | ORAL | 0 refills | Status: DC | PRN

## 2022-08-05 NOTE — Patient Instructions (Signed)
Dizziness Dizziness is a common problem. It makes you feel unsteady or light-headed. You may feel like you are about to pass out (faint). Dizziness can lead to getting hurt if you stumble or fall. Dizziness can be caused by many things, including: Medicines. Not having enough water in your body (dehydration). Illness. Follow these instructions at home: Eating and drinking  Drink enough fluid to keep your pee (urine) pale yellow. This helps to keep you from getting dehydrated. Try to drink more clear fluids, such as water. Do not drink alcohol. Limit how much caffeine you drink or eat, if your doctor tells you to do that. Limit how much salt (sodium) you drink or eat, if your doctor tells you to do that. Activity  Avoid making quick movements. Stand up slowly from sitting in a chair, and steady yourself until you feel okay. In the morning, first sit up on the side of the bed. When you feel okay, stand up slowly while you hold onto something. Do this until you know that your balance is okay. If you need to stand in one place for a long time, move your legs often. Tighten and relax the muscles in your legs while you are standing. Do not drive or use machinery if you feel dizzy. Avoid bending down if you feel dizzy. Place items in your home so you can reach them easily without leaning over. Lifestyle Do not smoke or use any products that contain nicotine or tobacco. If you need help quitting, ask your doctor. Try to lower your stress level. You can do this by using methods such as yoga or meditation. Talk with your doctor if you need help. General instructions Watch your dizziness for any changes. Take over-the-counter and prescription medicines only as told by your doctor. Talk with your doctor if you think that you are dizzy because of a medicine that you are taking. Tell a friend or a family member that you are feeling dizzy. If he or she notices any changes in your behavior, have this  person call your doctor. Keep all follow-up visits. Contact a doctor if: Your dizziness does not go away. Your dizziness or light-headedness gets worse. You feel like you may vomit (are nauseous). You have trouble hearing. You have new symptoms. You are unsteady on your feet. You feel like the room is spinning. You have neck pain or a stiff neck. You have a fever. Get help right away if: You vomit or have watery poop (diarrhea), and you cannot eat or drink anything. You have trouble: Talking. Walking. Swallowing. Using your arms, hands, or legs. You feel generally weak. You are not thinking clearly, or you have trouble forming sentences. A friend or family member may notice this. You have: Chest pain. Pain in your belly (abdomen). Shortness of breath. Sweating. Your vision changes. You are bleeding. You have a very bad headache. These symptoms may be an emergency. Get help right away. Call your local emergency services (911 in the [place] Do not wait to see if the symptoms will go away. Do not drive yourself to the hospital. Summary Dizziness makes you feel unsteady or light-headed. You may feel like you are about to pass out (faint). Drink enough fluid to keep your pee (urine) pale yellow. Do not drink alcohol. Avoid making quick movements if you feel dizzy. Watch your dizziness for any changes. This information is not intended to replace advice given to you by your health care provider. Make sure you discuss any questions   you have with your health care provider. Document Revised: 12/06/2019 Document Reviewed: 12/09/2019 Elsevier Patient Education  2024 Elsevier Inc.  

## 2022-08-05 NOTE — Progress Notes (Signed)
Subjective:    Patient ID: Regina Barber, female    DOB: [birth year], 62 y.o.   MRN: [id number]  HPI  Pt presents to the clinic today with c/o dizziness and intermittent shortness of breath. She reports this started 2 days ago. She describes the dizziness as a sensation that the room is spinning. The dizziness is worse with position changes. She denies headache, vision changes, near syncope, chest pain, nausea or vomiting. She denies sinus symptoms. Her BP today is 100/58.She is taking benazepril and hydrochlorothiazide as prescribed. She drinks plenty of water during the day. She has a history of vertigo.  Review of Systems     Past Medical History:  Diagnosis Date   Allergy    Asthma    Genital warts    Hypertension     Current Outpatient Medications  Medication Sig Dispense Refill   albuterol (VENTOLIN HFA) 108 (90 Base) MCG/ACT inhaler Inhale 1-2 puffs into the lungs every 4 (four) hours as needed for wheezing or shortness of breath. 8.5 each 2   aspirin EC 81 MG tablet Take 81 mg by mouth daily.     benazepril (LOTENSIN) 10 MG tablet TAKE 2 TABLETS BY MOUTH EVERY DAY 180 tablet 1   cetirizine (ZYRTEC) 10 MG tablet Take 1 tablet (10 mg total) by mouth daily. 30 tablet 0   hydrochlorothiazide (HYDRODIURIL) 25 MG tablet Take 1 tablet (25 mg total) by mouth daily. 90 tablet 3   montelukast (SINGULAIR) 10 MG tablet TAKE 1 TABLET BY MOUTH EVERYDAY AT BEDTIME 90 tablet 1   No current facility-administered medications for this visit.    No Known Allergies  Family History  Problem Relation Age of Onset   Cirrhosis Father 53    Social History   Socioeconomic History   Marital status: Married    Spouse name: Not on file   Number of children: 2   Years of education: Associate's Degree   Highest education level: Associate degree: academic program  Occupational History   Occupation: School Custodian  Tobacco Use   Smoking status: Former    Current packs/day: 0.00     Average packs/day: 1 pack/day for 30.0 years (30.0 ttl pk-yrs)    Types: Cigarettes    Start date: 15    Quit date: 2008    Years since quitting: 16.5   Smokeless tobacco: Never   Tobacco comments:    Quit on own after birth of daughter  Vaping Use   Vaping status: Never Used  Substance and Sexual Activity   Alcohol use: No   Drug use: No    Comment: Former history of substance use   Sexual activity: Not on file  Other Topics Concern   Not on file  Social History Narrative   Not on file   Social Determinants of Health   Financial Resource Strain: Low Risk  (08/05/2022)   Overall Financial Resource Strain (CARDIA)    Difficulty of Paying Living Expenses: Not hard at all  Food Insecurity: No Food Insecurity (08/05/2022)   Hunger Vital Sign    Worried About Running Out of Food in the Last Year: Never true    Ran Out of Food in the Last Year: Never true  Transportation Needs: No Transportation Needs (08/05/2022)   PRAPARE - Administrator, Civil Service (Medical): No    Lack of Transportation (Non-Medical): No  Physical Activity: Sufficiently Active (08/05/2022)   Exercise Vital Sign    Days of Exercise per Week: 5 days  Minutes of Exercise per Session: 150+ min  Stress: No Stress Concern Present (08/05/2022)   [employer] of Occupational Health - Occupational Stress Questionnaire    Feeling of Stress : Not at all  Social Connections: Socially Integrated (08/05/2022)   Social Connection and Isolation Panel [NHANES]    Frequency of Communication with Friends and Family: More than three times a week    Frequency of Social Gatherings with Friends and Family: More than three times a week    Attends Religious Services: More than 4 times per year    Active Member of [employer] or Organizations: Yes    Attends Banker Meetings: 1 to 4 times per year    Marital Status: Married  Catering manager Violence: Not on file     Constitutional: Denies fever,  malaise, fatigue, headache or abrupt weight changes.  HEENT: Denies eye pain, eye redness, ear pain, ringing in the ears, wax buildup, runny nose, nasal congestion, bloody nose, or sore throat. Respiratory: Pt reports intermittent shortness of breath. Denies difficulty breathing, cough or sputum production.   Cardiovascular: Denies chest pain, chest tightness, palpitations or swelling in the hands or feet.  Gastrointestinal: Denies abdominal pain, bloating, constipation, diarrhea or blood in the stool.  GU: Denies urgency, frequency, pain with urination, burning sensation, blood in urine, odor or discharge. Musculoskeletal: Pt reports chronic joint pain. Denies decrease in range of motion, difficulty with gait, muscle pain or joint swelling.  Skin: Denies redness, rashes, lesions or ulcercations.  Neurological: Pt reports dizziness. Denies difficulty with memory, difficulty with speech or problems with balance and coordination.  Psych: Denies anxiety, depression, SI/HI.  No other specific complaints in a complete review of systems (except as listed in HPI above).  Objective:   Physical Exam  BP (!) 100/58   Pulse 76   Ht 5\' 2"  (1.575 m)   Wt 132 lb (59.9 kg)   SpO2 100%   BMI 24.14 kg/m   Wt Readings from Last 3 Encounters:  01/28/22 133 lb 9.6 oz (60.6 kg)  09/08/21 134 lb (60.8 kg)  07/15/21 134 lb 3.2 oz (60.9 kg)    General: Appears her stated age, well developed, well nourished in NAD. Skin: Warm, dry and intact.d. HEENT: Head: normal shape and size; Eyes: sclera white, no icterus, conjunctiva pink, PERRLA and EOMs intact; Ears: Tm's gray and intact, normal light reflex;  Cardiovascular: Normal rate and rhythm. S1,S2 noted.  No murmur, rubs or gallops noted. No JVD or BLE edema. No carotid bruits noted. Pulmonary/Chest: Normal effort and positive vesicular breath sounds. No respiratory distress. No wheezes, rales or ronchi noted.  Musculoskeletal:  No difficulty with gait.   Neurological: Alert and oriented. Cranial nerves II-XII grossly intact. Coordination normal.     BMET    Component Value Date/Time   NA 142 01/06/2022 0847   NA 143 04/11/2015 1127   K 3.9 01/06/2022 0847   K 4.3 04/11/2015 1127   CL 105 01/06/2022 0847   CL 102 04/11/2015 1127   CO2 30 01/06/2022 0847   GLUCOSE 99 01/06/2022 0847   BUN 19 01/06/2022 0847   BUN 13 04/11/2015 1127   CREATININE 1.10 (H) 01/06/2022 0847   CALCIUM 9.8 01/06/2022 0847   CALCIUM 9.9 04/11/2015 1127   GFRNONAA 59 (L) 12/30/2019 0829   GFRAA 68 12/30/2019 0829    Lipid Panel     Component Value Date/Time   CHOL 178 01/06/2022 0847   CHOL 172 04/11/2015 1127   TRIG  109 01/06/2022 0847   TRIG 82 04/11/2015 1127   HDL 42 (L) 01/06/2022 0847   HDL 59 04/11/2015 1127   CHOLHDL 4.2 01/06/2022 0847   VLDL 17 05/09/2016 0801   LDLCALC 114 (H) 01/06/2022 0847    CBC    Component Value Date/Time   WBC 5.3 01/06/2022 0847   RBC 4.67 01/06/2022 0847   HGB 12.6 01/06/2022 0847   HCT 38.3 01/06/2022 0847   HCT 41 04/11/2015 1127   PLT 291 01/06/2022 0847   PLT 276 04/11/2015 1127   MCV 82.0 01/06/2022 0847   MCV 85 04/11/2015 1127   MCH 27.0 01/06/2022 0847   MCHC 32.9 01/06/2022 0847   RDW 14.3 01/06/2022 0847   RDW 14.9 04/11/2015 1127   LYMPHSABS 1,717 01/06/2022 0847   MONOABS 378 05/09/2016 0801   EOSABS 159 01/06/2022 0847   BASOSABS 32 01/06/2022 0847    Hgb A1C Lab Results  Component Value Date   HGBA1C 6.1 (H) 01/06/2022            Assessment & Plan:   Dizziness:  Orthostatics negative, BP actually improved to her normal range Encouraged her tor drink lots of water as dehdyration can precipitate dizziness RX for meclizine 25 mg TID prn Make position changes slowly  Follow up with your PCP as previously scheduled Nicki Reaper, NP

## 2022-08-05 NOTE — Telephone Encounter (Signed)
Message from Alessandra Bevels sent at 08/05/2022  9:54 AM EDT  Summary: Dizziness Advice   Pt is calling to report dizziness for 3 days. Pt report that in the past Dr. Kirtland Bouchard dx this as vertigo. Please advise         Chief Complaint: vertigo- feels like room is spinning Symptoms: moderate vertigo /needs assistance to walk without falling Frequency: Wed Pertinent Negatives: Patient denies fever, chest pain, numbness or weakness to face, arms or legs, committing, diarrhea, bleeding Disposition: [] ED /[] Urgent Care (no appt availability in office) / [x] Appointment(In office/virtual)/ []  [facility]/ [] Home Care/ [] Refused Recommended Disposition /[] [facility]/ []  Follow-up with PCP Additional Notes: no appt's open with PCP= offered appt with Pamala Hurry NP and pt accepted.  Reason for Disposition  [1] MODERATE dizziness (e.g., interferes with normal activities) AND [2] has NOT been evaluated by doctor (or NP/PA) for this  (Exception: Dizziness caused by heat exposure, sudden standing, or poor fluid intake.)  Answer Assessment - Initial Assessment Questions 1. DESCRIPTION: "Describe your dizziness."     Has to close eyes and be escorted to bathroom  2. LIGHTHEADED: "Do you feel lightheaded?" (e.g., somewhat faint, woozy, weak upon standing)     Faint woozy  3. VERTIGO: "Do you feel like either you or the room is spinning or tilting?" (i.e. vertigo)     spinning 4. SEVERITY: "How bad is it?"  "Do you feel like you are going to faint?" "Can you stand and walk?"   - MILD: Feels slightly dizzy, but walking normally.   - MODERATE: Feels unsteady when walking, but not falling; interferes with normal activities (e.g., school, work).   - SEVERE: Unable to walk without falling, or requires assistance to walk without falling; feels like passing out now.      moderate 5. ONSET:  "When did the dizziness begin?"     Wednesday  6. AGGRAVATING FACTORS: "Does anything make it worse?" (e.g.,  standing, change in head position)     Standing and sitting  8. CAUSE: "What do you think is causing the dizziness?"     Unknown  9. RECURRENT SYMPTOM: "Have you had dizziness before?" If Yes, ask: "When was the last time?" "What happened that time?"     Veritigo - exercisies 10. OTHER SYMPTOMS: "Do you have any other symptoms?" (e.g., fever, chest pain, vomiting, diarrhea, bleeding)       No  Protocols used: Dizziness - Lightheadedness-A-AH

## 2022-08-17 IMAGING — CT CT NECK W/ CM
4 of 5 series · 14 of 33 positions shown, 16 images · IV contrast (omnipaque)
Comparison: None.

CLINICAL DATA: Left-sided facial swelling

EXAM:
CT NECK WITH CONTRAST
TECHNIQUE: Multidetector CT imaging of the neck was performed using the
standard protocol following the bolus administration of intravenous
contrast.
CONTRAST:  75mL OMNIPAQUE IOHEXOL 300 MG/ML  SOLN

[Series 3: axial neck neck (person_name) 2.00 · axial · 0.53mm/px · z∈[-654,-578]mm · 2 of 114 slices shown]
[im 38/114  bone]
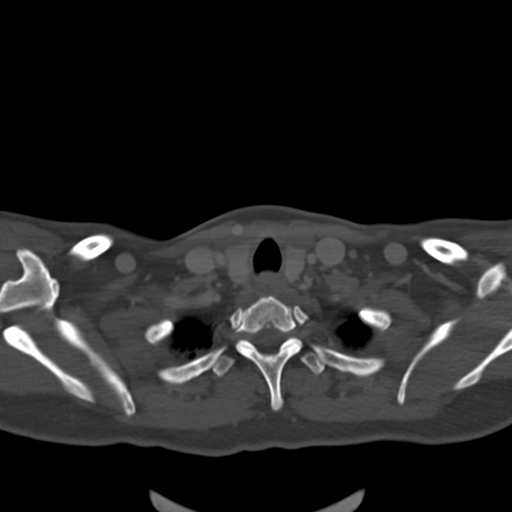
[im 76/114  bone]
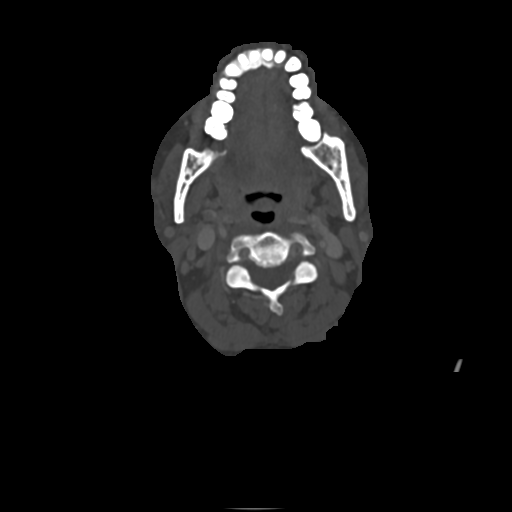

[Series 5: coronal neck neck (person_name) 2.00 cor · coronal · 0.53mm/px · 3 of 128 slices shown]
[im 42/128  bone]
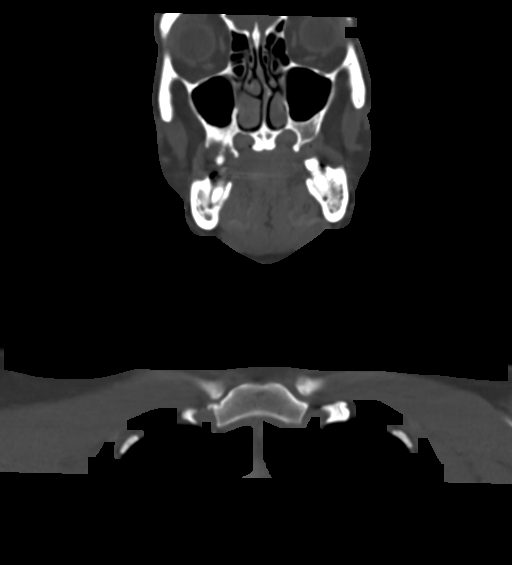
[im 57/128  bone]
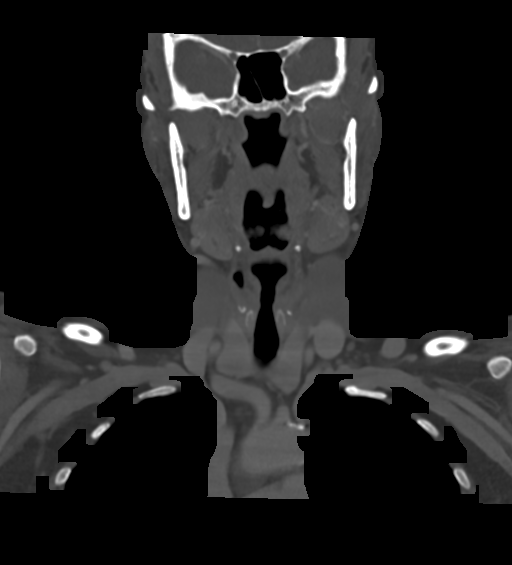
[im 71/128  bone]
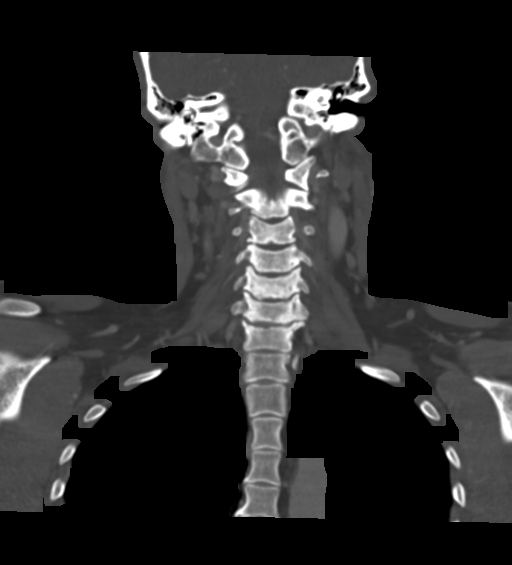

[Series 7: sagittal neck neck (person_name) 2.00 sag · sagittal · 0.50mm/px · 5 of 135 slices shown, 6 images]
[im 45/135  bone]
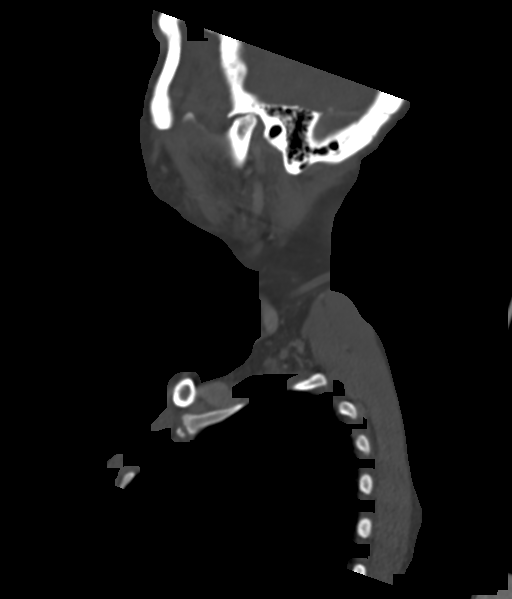
[im 56/135  bone]
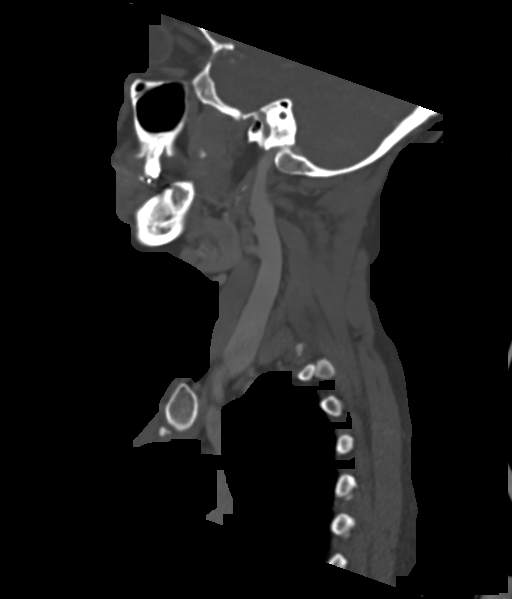
[im 68/135  soft-tissue]
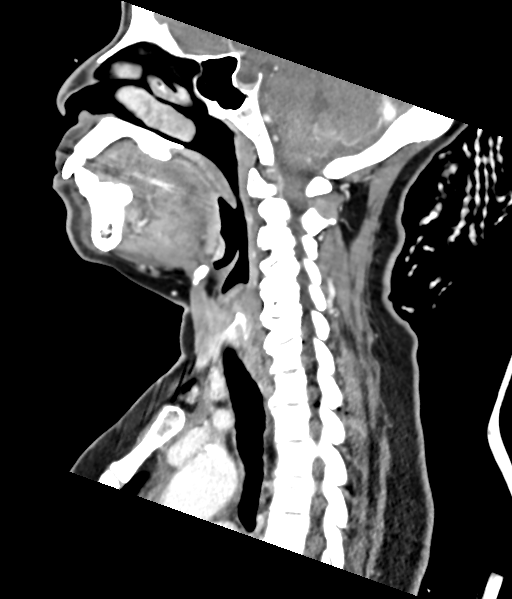
[im 68/135  bone]
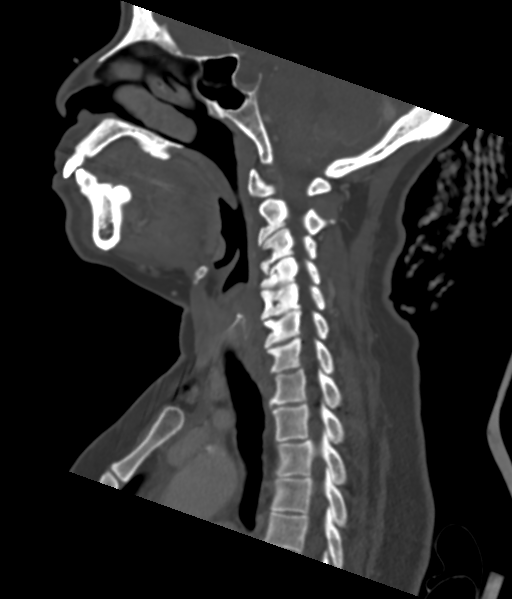
[im 79/135  bone]
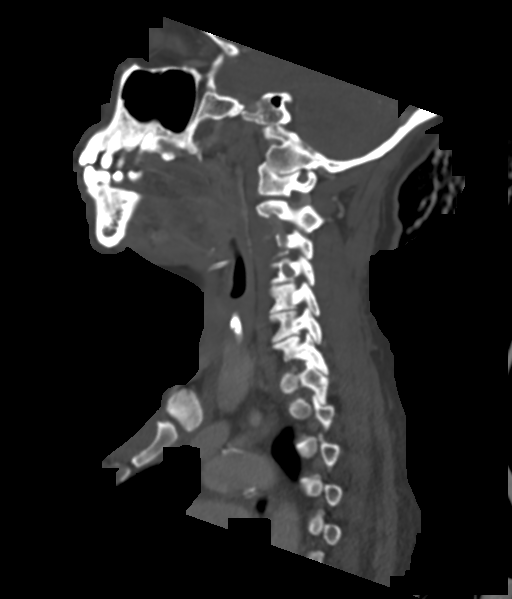
[im 90/135  bone]
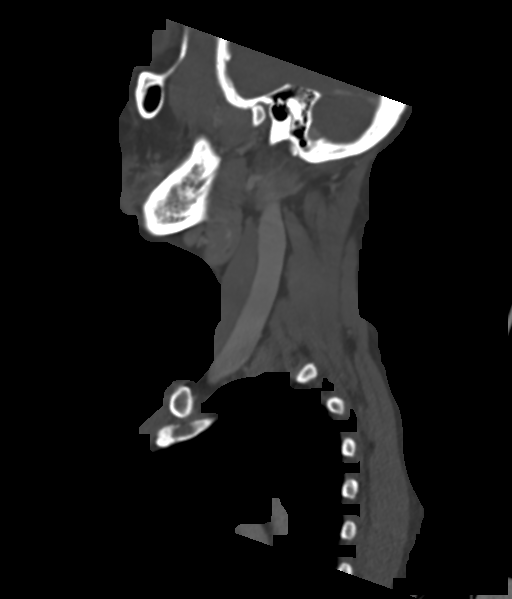

[Series 9: ax oropharynx neck neck (person_name) 2.00 ax · axial · 0.50mm/px · z∈[-749,-581]mm · 4 of 149 slices shown, 5 images]
[im 30/149  soft-tissue]
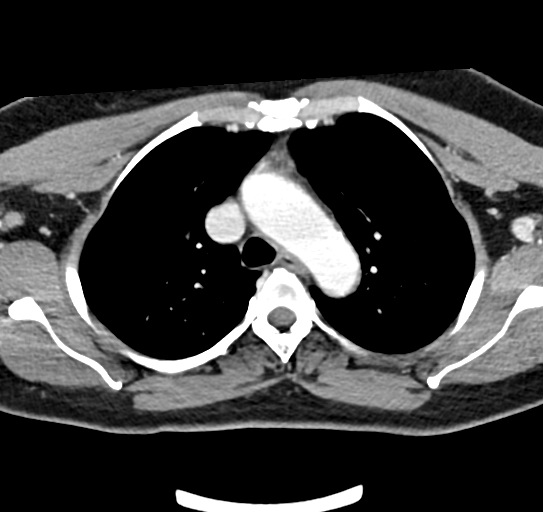
[im 30/149  bone]
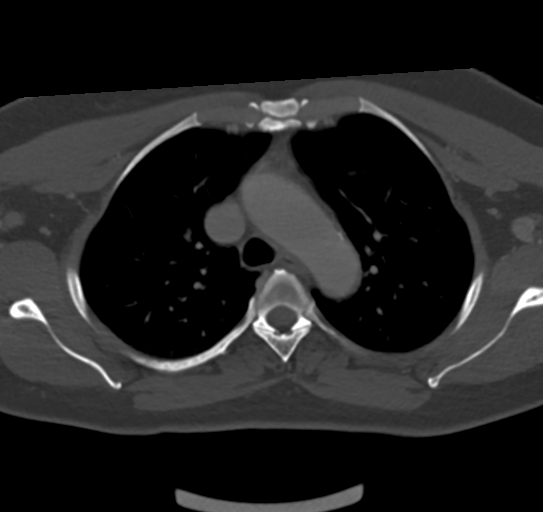
[im 60/149  bone]
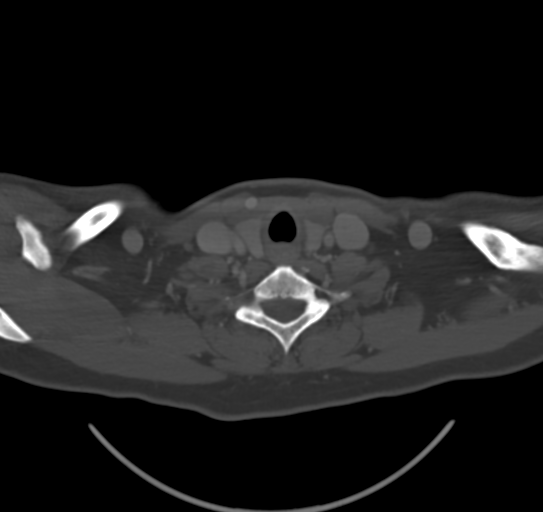
[im 89/149  bone]
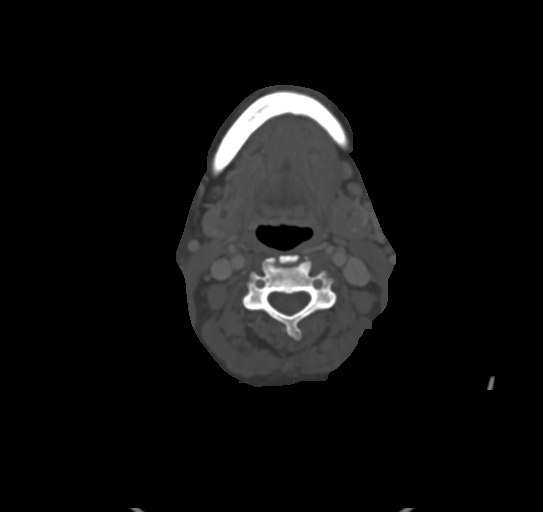
[im 119/149  bone]
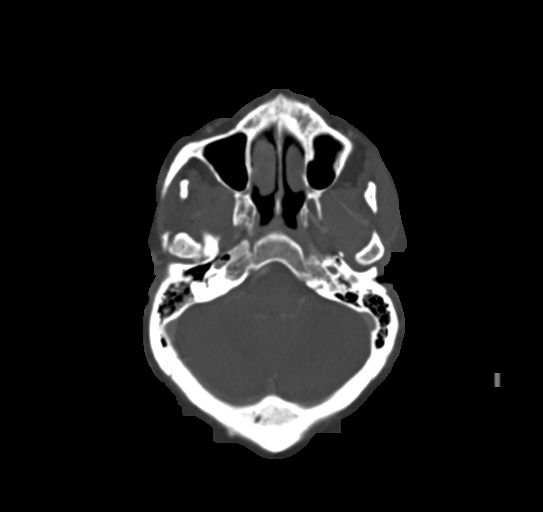

[14 of 33 positions shown; findings below may reference images not displayed]

FINDINGS: PHARYNX AND LARYNX: The nasopharynx, oropharynx and larynx are
normal. Visible portions of the oral cavity, tongue base and floor
of mouth are normal. Normal epiglottis, vallecula and pyriform
sinuses. The larynx is normal. No retropharyngeal abscess, effusion
or lymphadenopathy. Torus palatini.

SALIVARY GLANDS: There are few punctate calcifications in the
inferior aspect of the left parotid gland. There also a few
calcifications in the left submandibular gland. There is mild
bilateral submandibular ductal ectasia. No stones within the
salivary ducts.

THYROID: Normal.

LYMPH NODES: No enlarged or abnormal density lymph nodes.

VASCULAR: Major cervical vessels are patent.

LIMITED INTRACRANIAL: Normal.

VISUALIZED ORBITS: Normal.

MASTOIDS AND VISUALIZED PARANASAL SINUSES: No fluid levels or
advanced mucosal thickening. No mastoid effusion.

SKELETON: No bony spinal canal stenosis. No lytic or blastic
lesions.

UPPER CHEST: Clear.

OTHER: None.
IMPRESSION: 1. Punctate calcifications in the inferior aspect of the left
parotid gland and left submandibular gland, consistent with
sialolithiasis. No stones within the carotid ducts.
2. Mild bilateral submandibular ductal ectasia without stones within
the submandibular ducts.
3. No focal abnormality to explain the sensation of left facial
swelling.

## 2022-09-06 ENCOUNTER — Other Ambulatory Visit: Payer: Self-pay | Admitting: Internal Medicine

## 2022-09-07 NOTE — Telephone Encounter (Signed)
Requested medication (s) are due for refill today: routing for approval  Requested medication (s) are on the active medication list: yes  Last refill:  08/05/22  Future visit scheduled: no  Notes to clinic:  Unable to refill per protocol, cannot delegate.      Requested Prescriptions  Pending Prescriptions Disp Refills   meclizine (ANTIVERT) 25 MG tablet [Pharmacy Med Name: MECLIZINE 25 MG TABLET] 30 tablet 0    Sig: TAKE 1 TABLET BY MOUTH 3 TIMES DAILY AS NEEDED FOR DIZZINESS.     Not Delegated - Gastroenterology: Antiemetics Failed - 09/06/2022  1:25 PM      Failed - This refill cannot be delegated      Passed - Valid encounter within last 6 months    Recent Outpatient Visits           1 month ago Vertigo   [facility] [facility] [place], Salvadore Oxford, NP   7 months ago Annual physical exam   [place] [facility] Cords, [facility]   12 months ago Episode of dizziness   [place] [facility], Oswaldo Conroy, [place]   1 year ago Pre-diabetes   [place] [facility] Smitty Cords, DO   1 year ago Benign paroxysmal positional vertigo, unspecified laterality   [facility] [place], Netta Neat, [place]

## 2022-10-27 ENCOUNTER — Other Ambulatory Visit: Payer: Self-pay | Admitting: Family Medicine

## 2022-10-27 DIAGNOSIS — J3089 Other allergic rhinitis: Secondary | ICD-10-CM

## 2022-10-27 DIAGNOSIS — J4520 Mild intermittent asthma, uncomplicated: Secondary | ICD-10-CM

## 2022-10-27 NOTE — Telephone Encounter (Signed)
Requested Prescriptions  Pending Prescriptions Disp Refills   montelukast (SINGULAIR) 10 MG tablet [Pharmacy Med Name: MONTELUKAST SOD 10 MG TABLET] 90 tablet 0    Sig: TAKE 1 TABLET BY MOUTH EVERYDAY AT BEDTIME     Pulmonology:  Leukotriene Inhibitors Passed - 10/27/2022  1:36 AM      Passed - Valid encounter within last 12 months    Recent Outpatient Visits           2 months ago Vertigo   [place] [facility] [place], Salvadore Oxford, NP   9 months ago Annual physical exam   [place] [facility] Smitty Cords, DO   1 year ago Episode of dizziness   [facility], Oswaldo Conroy, [place]   1 year ago Pre-diabetes   [place] [facility] Smitty Cords, DO   1 year ago Benign paroxysmal positional vertigo, unspecified laterality   [facility] [place], Netta Neat, [place]

## 2022-11-28 ENCOUNTER — Other Ambulatory Visit: Payer: Self-pay | Admitting: Family Medicine

## 2022-11-29 NOTE — Telephone Encounter (Signed)
Requested medication (s) are due for refill today yes  Requested medication (s) are on the active medication list -yes  Future visit scheduled -no  Last refill: 09/07/22 #30 2RF  Notes to clinic: non delegated Rx  Requested Prescriptions  Pending Prescriptions Disp Refills   meclizine (ANTIVERT) 25 MG tablet [Pharmacy Med Name: MECLIZINE 25 MG TABLET] 30 tablet 2    Sig: TAKE 1 TABLET BY MOUTH 3 TIMES A DAY AS NEEDED FOR DIZZINESS     Not Delegated - Gastroenterology: Antiemetics Failed - 11/28/2022 10:18 AM      Failed - This refill cannot be delegated      Passed - Valid encounter within last 6 months    Recent Outpatient Visits           3 months ago Vertigo   [place] [facility] [place], Salvadore Oxford, NP   10 months ago Annual physical exam   [place] [facility] Smitty Cords, DO   1 year ago Episode of dizziness   [place] [facility], Oswaldo [place], [place]   1 year ago Pre-diabetes   [place] [facility] Smitty Cords, DO   1 year ago Benign paroxysmal positional vertigo, unspecified laterality   [place] [facility] Smit[facility] C[facility], DO                 Requested Prescriptions  Pending Prescriptions Disp Refills   meclizine (ANTIVERT) 25 MG tablet [Pharmacy Med Name: MECLIZINE 25 MG TABLET] 30 tablet 2    Sig: TAKE 1 TABLET BY MOUTH 3 TIMES A DAY AS NEEDED FOR DIZZINESS     Not Delegated - Gastroenterology: Antiemetics Failed - 11/28/2022 10:18 AM      Failed - This refill cannot be delegated      Passed - Valid encounter within last 6 months    Recent Outpatient Visits           3 months ago Vertigo   [place] [facility] [place], Salvadore Oxford, NP   10 months ago Annual physical exam   [place] [facility] Smitty Cords, DO   1 year ago Episode of dizziness    [facility], Oswaldo Conroy, [place]   1 year ago Pre-diabetes    [facility]   1 year ago Benign paroxysmal positional vertigo, unspecified laterality   [facility] [place], Netta Neat, [place]

## 2022-12-01 ENCOUNTER — Other Ambulatory Visit: Payer: Self-pay | Admitting: Family Medicine

## 2022-12-01 DIAGNOSIS — I10 Essential (primary) hypertension: Secondary | ICD-10-CM

## 2022-12-01 NOTE — Telephone Encounter (Signed)
Patient will need an office visit for further refills. Requested Prescriptions  Pending Prescriptions Disp Refills   benazepril (LOTENSIN) 10 MG tablet [Pharmacy Med Name: BENAZEPRIL HCL 10 MG TABLET] 180 tablet 0    Sig: TAKE 2 TABLETS BY MOUTH EVERY DAY     Cardiovascular:  ACE Inhibitors Failed - 12/01/2022  1:35 AM      Failed - Cr in normal range and within 180 days    Creat  Date Value Ref Range Status  01/06/2022 1.10 (H) 0.50 - 1.05 mg/dL Final         Failed - K in normal range and within 180 days    Potassium  Date Value Ref Range Status  01/06/2022 3.9 3.5 - 5.3 mmol/L Final  04/11/2015 4.3 mmol/L Final         Passed - Patient is not pregnant      Passed - Last BP in normal range    BP Readings from Last 1 Encounters:  08/05/22 (!) 100/58         Passed - Valid encounter within last 6 months    Recent Outpatient Visits           3 months ago Vertigo   [place] [facility] [facility] [place], Salvadore Oxford, NP   10 months ago Annual physical exam   [place] [facility] Smitty Cords, DO   1 year ago Episode of dizziness   [place] [facility], Oswaldo Con[place], [place]   1 year ago Pre-diabetes    [facility]mit[facility] C[facility], DO   1 year ago Benign paroxysmal positional vertigo, unspecified laterality   [facility] [place], Netta Ne[place], [place]

## 2023-02-22 ENCOUNTER — Other Ambulatory Visit: Payer: Self-pay | Admitting: Family Medicine

## 2023-02-22 DIAGNOSIS — I10 Essential (primary) hypertension: Secondary | ICD-10-CM

## 2023-02-23 NOTE — Telephone Encounter (Signed)
 Requested medications are due for refill today.  yes  Requested medications are on the active medications list.  yes  Last refill. 01/28/2022 #90 3 rf  Future visit scheduled.   no  Notes to clinic.  Labs are expired. Pt needs an office visit.    Requested Prescriptions  Pending Prescriptions Disp Refills   hydrochlorothiazide  (HYDRODIURIL ) 25 MG tablet [Pharmacy Med Name: HYDROCHLOROTHIAZIDE  25 MG TAB] 90 tablet 3    Sig: Take 1 tablet (25 mg total) by mouth daily.     Cardiovascular: Diuretics - Thiazide Failed - 02/23/2023 12:04 PM      Failed - Cr in normal range and within 180 days    Creat  Date Value Ref Range Status  01/06/2022 1.10 (H) 0.50 - 1.05 mg/dL Final         Failed - K in normal range and within 180 days    Potassium  Date Value Ref Range Status  01/06/2022 3.9 3.5 - 5.3 mmol/L Final  04/11/2015 4.3 mmol/L Final         Failed - Na in normal range and within 180 days    Sodium  Date Value Ref Range Status  01/06/2022 142 135 - 146 mmol/L Final  04/11/2015 143  Final         Failed - Valid encounter within last 6 months    Recent Outpatient Visits           6 months ago Vertigo   [place] [facility] [place], Angeline ORN, NP   1 year ago Annual physical exam   [place] [facility] [place] Marsa PARAS, DO   1 year ago Episode of dizziness   [place] [facility]um, [place] BRAVO, [place]   1 year ago Pre-diabetes   [place] [facility] [place], Marsa PARAS, DO   2 years ago Benign paroxysmal positional vertigo, unspecified laterality   [facility] [place], Marsa PARAS, DO              Passed - Last BP in normal range    BP Readings from Last 1 Encounters:  08/05/22 (!) 100/58

## 2023-03-06 ENCOUNTER — Other Ambulatory Visit: Payer: Self-pay | Admitting: Family Medicine

## 2023-03-06 DIAGNOSIS — I10 Essential (primary) hypertension: Secondary | ICD-10-CM

## 2023-03-07 NOTE — Telephone Encounter (Signed)
Requested medications are due for refill today.  yes  Requested medications are on the active medications list.  yes  Last refill. 12/01/2022 #180 0 rf  Future visit scheduled.   no  Notes to clinic.  Labs are expired. Pt is due for an ov.     Requested Prescriptions  Pending Prescriptions Disp Refills   benazepril (LOTENSIN) 10 MG tablet [Pharmacy Med Name: BENAZEPRIL HCL 10 MG TABLET] 90 tablet 3    Sig: TAKE 1 TABLET BY MOUTH EVERY DAY     Cardiovascular:  ACE Inhibitors Failed - 03/07/2023  7:36 AM      Failed - Cr in normal range and within 180 days    Creat  Date Value Ref Range Status  01/06/2022 1.10 (H) 0.50 - 1.05 mg/dL Final         Failed - K in normal range and within 180 days    Potassium  Date Value Ref Range Status  01/06/2022 3.9 3.5 - 5.3 mmol/L Final  04/11/2015 4.3 mmol/L Final         Failed - Valid encounter within last 6 months    Recent Outpatient Visits           7 months ago Vertigo   [place] [facility] [place], Salvadore Oxford, NP   1 year ago Annual physical exam   [place] [facility] Smitty Cords, DO   1 year ago Episode of dizziness   [place] [facility], Oswaldo Con[place], [place]   1 year ago Pre-diabetes   [place] [facility] [place], Netta Neat, DO   2 years ago Benign paroxysmal positional vertigo, unspecified laterality   [place] [facility] [place], Netta Neat, [place]              Passed - Patient is not pregnant      Passed - Last BP in normal range    BP Readings from Last 1 Encounters:  08/05/22 (!) 100/58

## 2023-03-30 ENCOUNTER — Encounter: Payer: Self-pay | Admitting: Family Medicine

## 2023-04-03 ENCOUNTER — Ambulatory Visit: Admitting: Family Medicine

## 2023-04-03 ENCOUNTER — Telehealth: Payer: Self-pay

## 2023-04-03 ENCOUNTER — Encounter: Payer: Self-pay | Admitting: Family Medicine

## 2023-04-03 VITALS — BP 130/60 | HR 86 | Ht 62.0 in | Wt 137.0 lb

## 2023-04-03 DIAGNOSIS — E7800 Pure hypercholesterolemia, unspecified: Secondary | ICD-10-CM

## 2023-04-03 DIAGNOSIS — Z0000 Encounter for general adult medical examination without abnormal findings: Secondary | ICD-10-CM | POA: Diagnosis not present

## 2023-04-03 DIAGNOSIS — E559 Vitamin D deficiency, unspecified: Secondary | ICD-10-CM

## 2023-04-03 DIAGNOSIS — I10 Essential (primary) hypertension: Secondary | ICD-10-CM

## 2023-04-03 DIAGNOSIS — J4520 Mild intermittent asthma, uncomplicated: Secondary | ICD-10-CM

## 2023-04-03 DIAGNOSIS — R7303 Prediabetes: Secondary | ICD-10-CM

## 2023-04-03 DIAGNOSIS — R42 Dizziness and giddiness: Secondary | ICD-10-CM

## 2023-04-03 MED ORDER — HYDROCHLOROTHIAZIDE 25 MG PO TABS: 25.0000 mg | ORAL_TABLET | Freq: Every day | ORAL | 3 refills | Status: AC

## 2023-04-03 MED ORDER — MECLIZINE HCL 25 MG PO TABS
25.0000 mg | ORAL_TABLET | Freq: Three times a day (TID) | ORAL | 2 refills | Status: DC | PRN
Start: 2023-04-03 — End: 2023-10-17

## 2023-04-03 MED ORDER — BENAZEPRIL HCL 10 MG PO TABS: 10.0000 mg | ORAL_TABLET | Freq: Every day | ORAL | 3 refills | Status: DC

## 2023-04-03 NOTE — Progress Notes (Unsigned)
 Subjective:    Patient ID: Regina Barber, female    DOB: [birth year], 63 y.o.   MRN: [id number]  Regina Barber is a 63 y.o. female presenting on 04/03/2023 for No chief complaint on file.   HPI  Discussed the use of AI scribe software for clinical note transcription with the patient, who gave verbal consent to proceed.  History of Present Illness          Here for Annual Physical and Lab Review   CHRONIC HTN: Reports no new concerns. Current Meds - Benazepril 10mg  daily, HCTZ 25mg  daily   Reports good compliance, took meds today. Tolerating well, w/o complaints. Denies CP, dyspnea, HA, edema, dizziness / lightheadedness   Pre-Diabetes Improved A1c 6.1, prior 6.2 to 6.3 CBGs: Not checking Meds: never on meds Currently on ACEi Taking ASA 81 Lifestyle: - Diet (improved diet regimen) - Exercise (Walking regularly at work) Denies hypoglycemia, polyuria, visual changes, numbness or tingling.   Hyperlipidemia Controlled lipid panel. Not indicated for rx Statin therapy.      Health Maintenance: Declines Shingrix. Declines Flu Shot   Colonoscopy completed 2018, next due 2028, negative previously no polyps, repeat Colonoscopy   Mammogram completed 05/2022, next due 1 yr 05/2023 - at [facility], request when ready       01/07/2021    8:49 AM 07/06/2020    8:21 AM 03/15/2019    3:56 PM  Depression screen PHQ 2/9  Decreased Interest 0 0 0  Down, Depressed, Hopeless 0 0 0  PHQ - 2 Score 0 0 0  Altered sleeping 0 0   Tired, decreased energy 0 0   Change in appetite 0 0   Feeling bad or failure about yourself  0 0   Trouble concentrating 0 0   Moving slowly or fidgety/restless 0 0   Suicidal thoughts 0 0   PHQ-9 Score 0 0   Difficult doing work/chores Not difficult at all Not difficult at all        01/07/2021    8:49 AM 07/06/2020    8:22 AM  GAD 7 : Generalized Anxiety Score  Nervous, Anxious, on Edge 0 0  Control/stop worrying 0 0  Worry too much - different  things 0 0  Trouble relaxing 0 0  Restless 0 0  Easily annoyed or irritable 0 0  Afraid - awful might happen 0 0  Total GAD 7 Score 0 0  Anxiety Difficulty Not difficult at all Not difficult at all     Past Medical History:  Diagnosis Date   Allergy    Asthma    Genital warts    Hypertension    Past Surgical History:  Procedure Laterality Date   ABDOMINAL HYSTERECTOMY  1987   Bilateral ovaries spared, reportedly cervix removed.   COLONOSCOPY WITH PROPOFOL N/A 06/27/2016   Procedure: COLONOSCOPY WITH PROPOFOL;  Surgeon: Midge Minium, MD;  Location: [facility];  Service: Endoscopy;  Laterality: N/A;   POLYPECTOMY  06/27/2016   Procedure: POLYPECTOMY;  Surgeon: Midge Minium, MD;  Location: [facility];  Service: Endoscopy;;   Social History   Socioeconomic History   Marital status: Married    Spouse name: Not on file   Number of children: 2   Years of education: Associate's Degree   Highest education level: Associate degree: academic program  Occupational History   Occupation: School Custodian  Tobacco Use   Smoking status: Former    Current packs/day: 0.00    Average packs/day: 1 pack/day for  30.0 years (30.0 ttl pk-yrs)    Types: Cigarettes    Start date: 89    Quit date: 2008    Years since quitting: 17.2   Smokeless tobacco: Never   Tobacco comments:    Quit on own after birth of daughter  Vaping Use   Vaping status: Never Used  Substance and Sexual Activity   Alcohol use: No   Drug use: No    Comment: Former history of substance use   Sexual activity: Not on file  Other Topics Concern   Not on file  Social History Narrative   Not on file   Social Drivers of Health   Financial Resource Strain: Low Risk  (03/31/2023)   Overall Financial Resource Strain (CARDIA)    Difficulty of Paying Living Expenses: Not very hard  Food Insecurity: No Food Insecurity (03/31/2023)   Hunger Vital Sign    Worried About Running Out of Food in the Last  Year: Never true    Ran Out of Food in the Last Year: Never true  Transportation Needs: No Transportation Needs (03/31/2023)   PRAPARE - Administrator, Civil Service (Medical): No    Lack of Transportation (Non-Medical): No  Physical Activity: Sufficiently Active (03/31/2023)   Exercise Vital Sign    Days of Exercise per Week: 5 days    Minutes of Exercise per Session: 30 min  Stress: No Stress Concern Present (03/31/2023)   [employer] of Occupational Health - Occupational Stress Questionnaire    Feeling of Stress : Not at all  Social Connections: Socially Integrated (03/31/2023)   Social Connection and Isolation Panel [NHANES]    Frequency of Communication with Friends and Family: More than three times a week    Frequency of Social Gatherings with Friends and Family: More than three times a week    Attends Religious Services: More than 4 times per year    Active Member of [employer] or Organizations: Yes    Attends Engineer, structural: More than 4 times per year    Marital Status: Married  Catering manager Violence: Not on file   Family History  Problem Relation Age of Onset   Cirrhosis Father 72   Current Outpatient Medications on File Prior to Visit  Medication Sig   albuterol (VENTOLIN HFA) 108 (90 Base) MCG/ACT inhaler Inhale 1-2 puffs into the lungs every 4 (four) hours as needed for wheezing or shortness of breath.   aspirin EC 81 MG tablet Take 81 mg by mouth daily.   cetirizine (ZYRTEC) 10 MG tablet Take 1 tablet (10 mg total) by mouth daily.   No current facility-administered medications on file prior to visit.    Review of Systems Per HPI unless specifically indicated above     Objective:    BP 130/60   Pulse 86   Ht 5\' 2"  (1.575 m)   Wt 137 lb (62.1 kg)   SpO2 98%   BMI 25.06 kg/m   Wt Readings from Last 3 Encounters:  04/03/23 137 lb (62.1 kg)  08/05/22 132 lb (59.9 kg)  01/28/22 133 lb 9.6 oz (60.6 kg)    Physical Exam  Results  for orders placed or performed in visit on 01/05/22  TSH   Collection Time: 01/06/22  8:47 AM  Result Value Ref Range   TSH 1.45 0.40 - 4.50 mIU/L  Hemoglobin A1c   Collection Time: 01/06/22  8:47 AM  Result Value Ref Range   Hgb A1c MFr Bld 6.1 (H) <  5.7 % of total Hgb   Mean Plasma Glucose 128 mg/dL   eAG (mmol/L) 7.1 mmol/L  Lipid panel   Collection Time: 01/06/22  8:47 AM  Result Value Ref Range   Cholesterol 178 <200 mg/dL   HDL 42 (L) > OR = 50 mg/dL   Triglycerides 578 <469 mg/dL   LDL Cholesterol (Calc) 114 (H) mg/dL (calc)   Total CHOL/HDL Ratio 4.2 <5.0 (calc)   Non-HDL Cholesterol (Calc) 136 (H) <130 mg/dL (calc)  CBC with Differential/Platelet   Collection Time: 01/06/22  8:47 AM  Result Value Ref Range   WBC 5.3 3.8 - 10.8 Thousand/uL   RBC 4.67 3.80 - 5.10 Million/uL   Hemoglobin 12.6 11.7 - 15.5 g/dL   HCT 62.9 52.8 - 41.3 %   MCV 82.0 80.0 - 100.0 fL   MCH 27.0 27.0 - 33.0 pg   MCHC 32.9 32.0 - 36.0 g/dL   RDW 24.4 01.0 - 27.2 %   Platelets 291 140 - 400 Thousand/uL   MPV 11.6 7.5 - 12.5 fL   Neutro Abs 2,873 1,500 - 7,800 cells/uL   Lymphs Abs 1,717 850 - 3,900 cells/uL   Absolute Monocytes 519 200 - 950 cells/uL   Eosinophils Absolute 159 15 - 500 cells/uL   Basophils Absolute 32 0 - 200 cells/uL   Neutrophils Relative % 54.2 %   Total Lymphocyte 32.4 %   Monocytes Relative 9.8 %   Eosinophils Relative 3.0 %   Basophils Relative 0.6 %  COMPLETE METABOLIC PANEL WITH GFR   Collection Time: 01/06/22  8:47 AM  Result Value Ref Range   Glucose, Bld 99 65 - 99 mg/dL   BUN 19 7 - 25 mg/dL   Creat 5.36 (H) 6.44 - 1.05 mg/dL   eGFR 57 (L) > OR = 60 mL/min/1.32m2   BUN/Creatinine Ratio 17 6 - 22 (calc)   Sodium 142 135 - 146 mmol/L   Potassium 3.9 3.5 - 5.3 mmol/L   Chloride 105 98 - 110 mmol/L   CO2 30 20 - 32 mmol/L   Calcium 9.8 8.6 - 10.4 mg/dL   Total Protein 7.2 6.1 - 8.1 g/dL   Albumin 4.3 3.6 - 5.1 g/dL   Globulin 2.9 1.9 - 3.7 g/dL (calc)    AG Ratio 1.5 1.0 - 2.5 (calc)   Total Bilirubin 0.3 0.2 - 1.2 mg/dL   Alkaline phosphatase (APISO) 70 37 - 153 U/L   AST 14 10 - 35 U/L   ALT 12 6 - 29 U/L      Assessment & Plan:   Problem List Items Addressed This Visit     Asthma   Essential hypertension   Relevant Medications   benazepril (LOTENSIN) 10 MG tablet   hydrochlorothiazide (HYDRODIURIL) 25 MG tablet   Other Relevant Orders   COMPLETE METABOLIC PANEL WITH GFR   CBC with Differential/Platelet   CT CARDIAC SCORING (SELF PAY ONLY)   Hyperlipidemia   Relevant Medications   benazepril (LOTENSIN) 10 MG tablet   hydrochlorothiazide (HYDRODIURIL) 25 MG tablet   Other Relevant Orders   TSH   Lipid panel   COMPLETE METABOLIC PANEL WITH GFR   CT CARDIAC SCORING (SELF PAY ONLY)   Pre-diabetes   Relevant Orders   Hemoglobin A1c   CT CARDIAC SCORING (SELF PAY ONLY)   Other Visit Diagnoses       Annual physical exam    -  Primary   Relevant Orders   TSH   Lipid panel   Hemoglobin  A1c   COMPLETE METABOLIC PANEL WITH GFR   CBC with Differential/Platelet     Vitamin D deficiency       Relevant Orders   VITAMIN D 25 Hydroxy (Vit-D Deficiency, Fractures)     Vertigo       Relevant Medications   meclizine (ANTIVERT) 25 MG tablet        Updated Health Maintenance information ***- Reviewed recent lab results with patient Encouraged improvement to lifestyle with diet and exercise -*** Goal of weight loss  Assessment and Plan              Orders Placed This Encounter  Procedures   CT CARDIAC SCORING (SELF PAY ONLY)    Standing Status:   Future    Expiration Date:   04/02/2024    Preferred imaging location?:   [facility]   TSH   Lipid panel    Has the patient fasted?:   Yes   Hemoglobin A1c   COMPLETE METABOLIC PANEL WITH GFR   CBC with Differential/Platelet   VITAMIN D 25 Hydroxy (Vit-D Deficiency, Fractures)    Meds ordered this encounter  Medications   benazepril (LOTENSIN) 10 MG tablet     Sig: Take 1 tablet (10 mg total) by mouth daily.    Dispense:  90 tablet    Refill:  3   hydrochlorothiazide (HYDRODIURIL) 25 MG tablet    Sig: Take 1 tablet (25 mg total) by mouth daily.    Dispense:  90 tablet    Refill:  3   meclizine (ANTIVERT) 25 MG tablet    Sig: Take 1 tablet (25 mg total) by mouth 3 (three) times daily as needed for dizziness.    Dispense:  30 tablet    Refill:  2     Follow up plan: Return for 1 year fasting lab > 1 week later Annual Physical.  Saralyn Pilar, DO [facility] 04/03/2023, 3:18 PM

## 2023-04-03 NOTE — Telephone Encounter (Signed)
 Copied from CRM [id number]. Topic: Appointments - Appointment Info/Confirmation >> Apr 03, 2023  1:02 PM Nyra Capes wrote: Patient/patient representative is calling for information regarding an appointment. Patient stated she will be 10 minutes late for her appt.

## 2023-04-03 NOTE — Patient Instructions (Addendum)
 Thank you for coming to the office today.  Refilled BP medications  Colonoscopy 2028  Labs today  Recommend Mammogram 05/2023 - [facility]  You have been referred for a Coronary Calcium Score Cardiac CT Scan. This is a screening test for patients aged 63-50+ with cardiovascular risk factors or who are healthy but would be interested in Cardiovascular Screening for heart disease. Even if there is a family history of heart disease, this imaging can be useful. Typically it can be done every 5+ years or at a different timeline we agree on  The scan will look at the chest and mainly focus on the heart and identify early signs of calcium build up or blockages within the heart arteries. It is not 100% accurate for identifying blockages or heart disease, but it is useful to help [place] predict who may have some early changes or be at risk in the future for a heart attack or cardiovascular problem.  The results are reviewed by a Cardiologist and they will document the results. It should become available on MyChart. Typically the results are divided into percentiles based on other patients of the same demographic and age. So it will compare your risk to others similar to you. If you have a higher score >99 or higher percentile >75%tile, it is recommended to consider Statin cholesterol therapy and or referral to Cardiologist. I will try to help explain your results and if we have questions we can contact the Cardiologist.  You will be contacted for scheduling. Usually it is done at any imaging facility through [facility], [facility] or [facility].  The cost is $99 flat fee total and it does not go through insurance, so no authorization is required.   Please schedule a Follow-up Appointment to: Return for 1 year fasting lab > 1 week later Annual Physical.  If you have any other questions or concerns, please feel free to call the office or send a message through MyChart. You may  also schedule an earlier appointment if necessary.  Additionally, you may be receiving a survey about your experience at our office within a few days to 1 week by e-mail or mail. We value your feedback.  Saralyn Pilar, DO [facility], [place]

## 2023-04-04 LAB — COMPLETE METABOLIC PANEL WITH GFR
AG Ratio: 1.4 (calc) (ref 1.0–2.5)
ALT: 36 U/L — ABNORMAL HIGH (ref 6–29)
AST: 21 U/L (ref 10–35)
Albumin: 4.1 g/dL (ref 3.6–5.1)
Alkaline phosphatase (APISO): 72 U/L (ref 37–153)
BUN: 19 mg/dL (ref 7–25)
CO2: 28 mmol/L (ref 20–32)
Calcium: 9.3 mg/dL (ref 8.6–10.4)
Chloride: 105 mmol/L (ref 98–110)
Creat: 0.98 mg/dL (ref 0.50–1.05)
Globulin: 2.9 g/dL (ref 1.9–3.7)
Glucose, Bld: 84 mg/dL (ref 65–99)
Potassium: 3.5 mmol/L (ref 3.5–5.3)
Sodium: 142 mmol/L (ref 135–146)
Total Bilirubin: 0.3 mg/dL (ref 0.2–1.2)
Total Protein: 7 g/dL (ref 6.1–8.1)
eGFR: 65 mL/min/{1.73_m2} (ref >=60)

## 2023-04-04 LAB — CBC WITH DIFFERENTIAL/PLATELET
Absolute Lymphocytes: 2151 {cells}/uL (ref 850–3900)
Absolute Monocytes: 639 {cells}/uL (ref 200–950)
Basophils Absolute: 31 {cells}/uL (ref 0–200)
Basophils Relative: 0.5 %
Eosinophils Absolute: 229 {cells}/uL (ref 15–500)
Eosinophils Relative: 3.7 %
HCT: 39 % (ref 35.0–45.0)
Hemoglobin: 12.8 g/dL (ref 11.7–15.5)
MCH: 27.5 pg (ref 27.0–33.0)
MCHC: 32.8 g/dL (ref 32.0–36.0)
MCV: 83.7 fL (ref 80.0–100.0)
MPV: 11.7 fL (ref 7.5–12.5)
Monocytes Relative: 10.3 %
Neutro Abs: 3150 {cells}/uL (ref 1500–7800)
Neutrophils Relative %: 50.8 %
Platelets: 279 10*3/uL (ref 140–400)
RBC: 4.66 10*6/uL (ref 3.80–5.10)
RDW: 14.5 % (ref 11.0–15.0)
Total Lymphocyte: 34.7 %
WBC: 6.2 10*3/uL (ref 3.8–10.8)

## 2023-04-04 LAB — LIPID PANEL
Cholesterol: 180 mg/dL (ref <200)
HDL: 35 mg/dL — ABNORMAL LOW (ref >=50)
LDL Cholesterol (Calc): 101 mg/dL — ABNORMAL HIGH
Non-HDL Cholesterol (Calc): 145 mg/dL — ABNORMAL HIGH (ref <130)
Total CHOL/HDL Ratio: 5.1 (calc) — ABNORMAL HIGH (ref <5.0)
Triglycerides: 333 mg/dL — ABNORMAL HIGH (ref <150)

## 2023-04-04 LAB — VITAMIN D 25 HYDROXY (VIT D DEFICIENCY, FRACTURES): Vit D, 25-Hydroxy: 12 ng/mL — ABNORMAL LOW (ref 30–100)

## 2023-04-04 LAB — HEMOGLOBIN A1C
Hgb A1c MFr Bld: 6 %{Hb} — ABNORMAL HIGH (ref <5.7)
Mean Plasma Glucose: 126 mg/dL
eAG (mmol/L): 7 mmol/L

## 2023-04-04 LAB — TSH: TSH: 1.21 m[IU]/L (ref 0.40–4.50)

## 2023-04-06 ENCOUNTER — Encounter: Payer: Self-pay | Admitting: Family Medicine

## 2023-05-10 ENCOUNTER — Ambulatory Visit: Payer: Self-pay

## 2023-05-10 ENCOUNTER — Encounter: Payer: Self-pay | Admitting: *Deleted

## 2023-05-10 ENCOUNTER — Ambulatory Visit
Admission: EM | Admit: 2023-05-10 | Discharge: 2023-05-10 | Disposition: A | Discharge status: Home / Self Care | Attending: Emergency Medicine | Admitting: Emergency Medicine

## 2023-05-10 DIAGNOSIS — J4521 Mild intermittent asthma with (acute) exacerbation: Secondary | ICD-10-CM

## 2023-05-10 DIAGNOSIS — R051 Acute cough: Secondary | ICD-10-CM | POA: Diagnosis not present

## 2023-05-10 MED ORDER — PREDNISONE 50 MG PO TABS: 50.0000 mg | ORAL_TABLET | Freq: Every day | ORAL | 0 refills | Status: DC

## 2023-05-10 NOTE — Telephone Encounter (Signed)
 Attempted to reach patient to follow up on message. Spouse answered the phone and advised that patient was headed to the urgent care for evaluation. No further concerns or actions needed at this time.

## 2023-05-10 NOTE — Discharge Instructions (Addendum)
 Take prednisone  as prescribed Drink plenty of water  Use home meds as prescribed(inhaler,zyrtec ) Follow up with PCP Go to ER for new or worsening issues(chest pain,shortness of breath, palpitations, etc)

## 2023-05-10 NOTE — Telephone Encounter (Signed)
 Chief Complaint: SOB Symptoms: SOB on exertion, wheezing, cough Frequency: symptoms since Monday Pertinent Negatives: Patient denies CP, fever, palpitations, dizziness  Disposition: [] ED /[x] Urgent Care (no appt availability in office) / [] Appointment(In office/virtual)/ []  [place] [facility]/ [] Home Care/ [] Refused Recommended Disposition /[] [place] [facility]/ []  Follow-up with PCP Additional Notes: Pt reports SOB on exertion with hx of asthma. Pt reports she had to use her albuterol  inhaler yesterday and today after not having used it for 4 yrs. Pt also reports wheezing with a runny nose and cough with a small amount of yellow to clear sputum. Pt denies CP, palpitations, dizziness, fever. RN advised pt she should be seen within 4 hrs. RN advised UC as there is no availability at the office within that time frame. Pt declined. RN advised pt RN would relay symptoms to the office for follow-up and scheduling. RN advised pt if her breathing difficulty worsens or she develops CP to call 911. Pt verbalized understanding.     Copied from CRM [id number]. Topic: Clinical - Red Word Triage >> May 10, 2023  3:25 PM Loreda Rodriguez T wrote: Red Word that prompted transfer to Nurse Triage: patient called said she is having shortness of breath. She think it is her asthma along with the pollen Reason for Disposition  [1] MILD difficulty breathing (e.g., minimal/no SOB at rest, SOB with walking, pulse <100) AND [2] NEW-onset or WORSE than normal  Answer Assessment - Initial Assessment Questions 1. RESPIRATORY STATUS: "Describe your breathing?" (e.g., wheezing, shortness of breath, unable to speak, severe coughing)      Pt states she had to use her albuterol  inhaler yesterday after not using it for 4 years. Pt used it again this AM. Hx of asthma.  2. ONSET: "When did this breathing problem begin?"      Started Monday "a little bit", just pollen and coughing. Yesterday pt noticed wheezing. Endorses wheezing  today as well. 3. PATTERN "Does the difficult breathing come and go, or has it been constant since it started?"      Intermittent  4. SEVERITY: "How bad is your breathing?" (e.g., mild, moderate, severe)    - MILD: No SOB at rest, mild SOB with walking, speaks normally in sentences, can lie down, no retractions, pulse < 100.    - MODERATE: SOB at rest, SOB with minimal exertion and prefers to sit, cannot lie down flat, speaks in phrases, mild retractions, audible wheezing, pulse 100-120.    - SEVERE: Very SOB at rest, speaks in single words, struggling to breathe, sitting hunched forward, retractions, pulse > 120      SOB only on exertion 5. RECURRENT SYMPTOM: "Have you had difficulty breathing before?" If Yes, ask: "When was the last time?" and "What happened that time?"      Yes with asthma  6. CARDIAC HISTORY: "Do you have any history of heart disease?" (e.g., heart attack, angina, bypass surgery, angioplasty)      HTN 7. LUNG HISTORY: "Do you have any history of lung disease?"  (e.g., pulmonary embolus, asthma, emphysema)     Asthma 8. CAUSE: "What do you think is causing the breathing problem?"      Asthma and pollen 9. OTHER SYMPTOMS: "Do you have any other symptoms? (e.g., dizziness, runny nose, cough, chest pain, fever)     Denies CP. Denies dizziness. Endorses cough - coughing on the phone. Pt states she coughed up yellow sputum this AM. Nasal drainage - yellow to clear. No fever. 10. O2 SATURATION MONITOR:  "  Do you use an oxygen saturation monitor (pulse oximeter) at home?" If Yes, ask: "What is your reading (oxygen level) today?" "What is your usual oxygen saturation reading?" (e.g., 95%) No  Protocols used: Breathing Difficulty-A-AH

## 2023-05-10 NOTE — ED Triage Notes (Signed)
 Patient states 2 days of cough and SOB, has a history of asthma but hadn't used inhaler in 4 years until yesterday, did have relief with use.

## 2023-05-10 NOTE — ED Provider Notes (Signed)
 [facility]    CSN: [id number] Arrival date & time: 05/10/23  1607      History   Chief Complaint Chief Complaint  Patient presents with   Cough   Shortness of Breath    HPI Regina Barber is a 63 y.o. female.   63 year old female pt, Regina Barber, presents to urgent care for evaluation of cough and shortness of breath x 2 days.  Patient states she has a history of asthma, used inhaler yesterday with relief of symptoms. Pt works at [employer]"not around kids", denies illness exposure. Pt states last time she had an asthma flare was ~ 4 years ago and required prednisone .   PMH: asthma, allergies, HTN,hyperlipidemia,prediabetes, previous smoker  The history is provided by the patient. No language interpreter was used.    Past Medical History:  Diagnosis Date   Allergy    Asthma    Genital warts    Hypertension     Patient Active Problem List   Diagnosis Date Noted   Mild intermittent asthma with exacerbation 05/10/2023   Acute cough 05/10/2023   Breast nodule 04/10/2020   Elevated ferritin 11/24/2017   Hyperlipidemia 11/08/2016   Polyp of sigmoid colon    Osteoarthritis of multiple joints 05/09/2016   De Quervain's tenosynovitis, right 05/09/2016   Former smoker 11/10/2015   Hot flash, menopausal 11/10/2015   Essential hypertension 11/09/2015   Asthma 11/09/2015   Environmental and seasonal allergies 11/09/2015   Chronic patellofemoral pain of left knee 11/09/2015   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   COLONOSCOPY WITH PROPOFOL  N/A 06/27/2016   Procedure: COLONOSCOPY WITH PROPOFOL ;  Surgeon: Marnee Sink, MD;  Location: [facility];  Service: Endoscopy;  Laterality: N/A;   POLYPECTOMY  06/27/2016   Procedure:  POLYPECTOMY;  Surgeon: Marnee Sink, MD;  Location: [facility];  Service: Endoscopy;;    OB History   No obstetric history on file.      Home Medications    Prior to Admission medications   Medication Sig Start Date End Date Taking? Authorizing Provider  predniSONE  (DELTASONE ) 50 MG tablet Take 1 tablet (50 mg total) by mouth daily with breakfast. 05/10/23  Yes Orah Sonnen, Eveleen Hinds, NP  albuterol  (VENTOLIN  HFA) 108 (90 Base) MCG/ACT inhaler Inhale 1-2 puffs into the lungs every 4 (four) hours as needed for wheezing or shortness of breath. 08/02/22   Karamalegos, Kayleen Party, DO  aspirin EC 81 MG tablet Take 81 mg by mouth daily.    [provider]  benazepril  (LOTENSIN ) 10 MG tablet Take 1 tablet (10 mg total) by mouth daily. 04/03/23   Karamalegos, Kayleen Party, DO  cetirizine  (ZYRTEC ) 10 MG tablet Take 1 tablet (10 mg total) by mouth daily. 11/13/17   Karamalegos, Kayleen Party, DO  hydrochlorothiazide  (HYDRODIURIL ) 25 MG tablet Take 1 tablet (25 mg total) by mouth daily. 04/03/23   Karamalegos, Kayleen Party, DO  meclizine  (ANTIVERT ) 25 MG tablet Take 1 tablet (25 mg total) by mouth 3 (three) times daily as needed for dizziness. 04/03/23   Raina Bunting, DO    Family History Family History  Problem Relation Age of Onset   Cirrhosis Father 83    Social History Social History   Tobacco Use   Smoking status: Former    Current packs/day:  0.00    Average packs/day: 1 pack/day for 30.0 years (30.0 ttl pk-yrs)    Types: Cigarettes    Start date: 44    Quit date: 2008    Years since quitting: 17.3   Smokeless tobacco: Never   Tobacco comments:    Quit on own after birth of daughter  Vaping Use   Vaping status: Never Used  Substance Use Topics   Alcohol use: No   Drug use: No    Comment: Former history of substance use     Allergies   Patient has no known allergies.   Review of Systems Review of Systems  Constitutional:  Negative for fever.   Respiratory:  Positive for cough and shortness of breath.   All other systems reviewed and are negative.    Physical Exam Triage Vital Signs ED Triage Vitals  Encounter Vitals Group     BP 05/10/23 1625 121/72     Systolic BP Percentile --      Diastolic BP Percentile --      Pulse Rate 05/10/23 1625 85     Resp 05/10/23 1625 18     Temp 05/10/23 1625 98.3 F (36.8 C)     Temp Source 05/10/23 1625 Oral     SpO2 05/10/23 1625 97 %     Weight 05/10/23 1624 134 lb (60.8 kg)     Height 05/10/23 1624 5\' 1"  (1.549 m)     Head Circumference --      Peak Flow --      Pain Score 05/10/23 1623 0     Pain Loc --      Pain Education --      Exclude from Growth Chart --    No data found.  Updated Vital Signs BP 121/72 (BP Location: Left Arm)   Pulse 85   Temp 98.3 F (36.8 C) (Oral)   Resp 18   Ht 5\' 1"  (1.549 m)   Wt 134 lb (60.8 kg)   SpO2 97%   BMI 25.32 kg/m   Visual Acuity Right Eye Distance:   Left Eye Distance:   Bilateral Distance:    Right Eye Near:   Left Eye Near:    Bilateral Near:     Physical Exam Vitals and nursing note reviewed.  Constitutional:      General: She is not in acute distress.    Appearance: She is well-developed.  HENT:     Head: Normocephalic.     Right Ear: Tympanic membrane is retracted.     Left Ear: Tympanic membrane is retracted.     Nose: Congestion present.     Mouth/Throat:     Lips: Pink.     Mouth: Mucous membranes are moist.     Pharynx: Oropharynx is clear.  Eyes:     General: Lids are normal.     Conjunctiva/sclera: Conjunctivae normal.     Pupils: Pupils are equal, round, and reactive to light.  Neck:     Trachea: No tracheal deviation.  Cardiovascular:     Rate and Rhythm: Normal rate and regular rhythm.     Pulses: Normal pulses.     Heart sounds: Normal heart sounds. No murmur heard. Pulmonary:     Effort: Pulmonary effort is normal.     Breath sounds: Normal breath sounds and air entry.  Abdominal:      General: Bowel sounds are normal.     Palpations: Abdomen is soft.     Tenderness: There is no abdominal  tenderness.  Musculoskeletal:        General: Normal range of motion.     Cervical back: Normal range of motion.  Lymphadenopathy:     Cervical: No cervical adenopathy.  Skin:    General: Skin is warm and dry.     Findings: No rash.  Neurological:     General: No focal deficit present.     Mental Status: She is alert and oriented to person, place, and time.     GCS: GCS eye subscore is 4. GCS verbal subscore is 5. GCS motor subscore is 6.  Psychiatric:        Attention and Perception: Attention normal.        Mood and Affect: Mood normal.        Speech: Speech normal.        Behavior: Behavior normal. Behavior is cooperative.      UC Treatments / Results  Labs (all labs ordered are listed, but only abnormal results are displayed) Labs Reviewed - No data to display  EKG   Radiology No results found.  Procedures Procedures (including critical care time)  Medications Ordered in UC Medications - No data to display  Initial Impression / Assessment and Plan / UC Course  I have reviewed the triage vital signs and the nursing notes.  Pertinent labs & imaging results that were available during my care of the patient were reviewed by me and considered in my medical decision making (see chart for details).    Discussed exam findings and plan of care with patient, strict go to ER precautions given.   Patient verbalized understanding to this provider.  Ddx: Asthma exacerbation, cough,uri,allergies Final Clinical Impressions(s) / UC Diagnoses   Final diagnoses:  Mild intermittent asthma with exacerbation  Acute cough     Discharge Instructions      Take prednisone  as prescribed Drink plenty of water  Use home meds as prescribed(inhaler,zyrtec ) Follow up with PCP Go to ER for new or worsening issues(chest pain,shortness of breath, palpitations, etc)     ED  Prescriptions     Medication Sig Dispense Auth. Provider   predniSONE  (DELTASONE ) 50 MG tablet Take 1 tablet (50 mg total) by mouth daily with breakfast. 5 tablet Ardon Franklin, Eveleen Hinds, NP      PDMP not reviewed this encounter.   Peter Brands, NP 05/10/23 2003

## 2023-05-16 ENCOUNTER — Ambulatory Visit: Admitting: Family Medicine

## 2023-05-16 ENCOUNTER — Encounter: Payer: Self-pay | Admitting: Family Medicine

## 2023-05-16 VITALS — BP 122/70 | HR 88 | Ht 61.0 in | Wt 137.0 lb

## 2023-05-16 DIAGNOSIS — J209 Acute bronchitis, unspecified: Secondary | ICD-10-CM | POA: Diagnosis not present

## 2023-05-16 MED ORDER — AZITHROMYCIN 250 MG PO TABS
ORAL_TABLET | ORAL | 0 refills | Status: AC
Start: 2023-05-16 — End: ?

## 2023-05-16 MED ORDER — PSEUDOEPH-BROMPHEN-DM 30-2-10 MG/5ML PO SYRP: 5.0000 mL | ORAL_SOLUTION | Freq: Four times a day (QID) | ORAL | 0 refills | Status: AC | PRN

## 2023-05-16 NOTE — Patient Instructions (Addendum)
 Thank you for coming to the office today.   1. It sounds like you had an Upper Respiratory Virus that has settled into a Bronchitis, lower respiratory tract infection. I  It is possible to be a walking pneumonia or bronchitis.  Start Azithromycin Z pak (antibiotic) 2 tabs day 1, then 1 tab x 4 days, complete entire course even if improved  - Use Albuterol  inhaler 2 puffs every 4-6 hours around the clock for next 2-3 days, max up to 5 days then use as needed  Stop Delsym Start Mucinex with or without DM  Use Cough Syrup (non codeine) can still make you a little drowsy.  - Use nasal saline (Simply Saline or Ocean Spray) to flush nasal congestion multiple times a day, may help cough - Drink plenty of fluids to improve congestion   Please schedule a Follow-up Appointment to: Return if symptoms worsen or fail to improve.  If you have any other questions or concerns, please feel free to call the office or send a message through MyChart. You may also schedule an earlier appointment if necessary.  Additionally, you may be receiving a survey about your experience at our office within a few days to 1 week by e-mail or mail. We value your feedback.  Domingo Friend, DO [facility], [place]

## 2023-05-16 NOTE — Progress Notes (Signed)
 Subjective:    Patient ID: Regina Barber, female    DOB: 1960/03/20, 63 y.o.   MRN: [id number]  Regina Barber is a 63 y.o. female presenting on 05/16/2023 for Cough   HPI  Discussed the use of AI scribe software for clinical note transcription with the patient, who gave verbal consent to proceed.  History of Present Illness   Regina Barber is a 63 year old female with asthma who presents with a persistent cough.  She has been experiencing a persistent cough since May 08, 2023. Initially, she sought care at an urgent care facility on May 10, 2023, where she was diagnosed with asthma and prescribed a steroid burst. Despite completing a five-day course of prednisone  at 50 mg daily, her symptoms did not improve.  The cough is described as 'a little wet' and occasionally productive, with clear mucus primarily coming from her nose. The cough has not significantly improved with the use of her home inhaler, albuterol , or over-the-counter medications like Zyrtec  and Delsym. She has not used antibiotics recently and has not experienced fever or sore throat.  She has a history of asthma but has not needed to use her inhaler regularly for years. Due to the current symptoms, she has been using her inhaler more frequently, although it only provides minimal relief. She also mentions that several colleagues at her workplace have had pneumonia recently.           05/16/2023    4:01 PM 04/03/2023    3:38 PM 01/07/2021    8:49 AM  Depression screen PHQ 2/9  Decreased Interest 0 0 0  Down, Depressed, Hopeless 0 0 0  PHQ - 2 Score 0 0 0  Altered sleeping 1 0 0  Tired, decreased energy 0 0 0  Change in appetite 0 0 0  Feeling bad or failure about yourself  0 0 0  Trouble concentrating 0 0 0  Moving slowly or fidgety/restless 0 0 0  Suicidal thoughts 0 0 0  PHQ-9 Score 1 0 0  Difficult doing work/chores   Not difficult at all       05/16/2023    4:01 PM 04/03/2023    3:39 PM 01/07/2021     8:49 AM 07/06/2020    8:22 AM  GAD 7 : Generalized Anxiety Score  Nervous, Anxious, on Edge 0 0 0 0  Control/stop worrying 0 0 0 0  Worry too much - different things 0 0 0 0  Trouble relaxing 0 0 0 0  Restless 0 0 0 0  Easily annoyed or irritable 0 0 0 0  Afraid - awful might happen 0 0 0 0  Total GAD 7 Score 0 0 0 0  Anxiety Difficulty   Not difficult at all Not difficult at all    Social History   Tobacco Use   Smoking status: Former    Current packs/day: 0.00    Average packs/day: 1 pack/day for 30.0 years (30.0 ttl pk-yrs)    Types: Cigarettes    Start date: 27    Quit date: 2008    Years since quitting: 17.3   Smokeless tobacco: Never   Tobacco comments:    Quit on own after birth of daughter  Vaping Use   Vaping status: Never Used  Substance Use Topics   Alcohol use: No   Drug use: No    Comment: Former history of substance use    Review of Systems Per HPI unless specifically indicated above  Objective:    BP 122/70 (BP Location: Right Arm, Patient Position: Sitting, Cuff Size: Normal)   Pulse 88   Ht 5\' 1"  (1.549 m)   Wt 137 lb (62.1 kg)   SpO2 99%   BMI 25.89 kg/m   Wt Readings from Last 3 Encounters:  05/16/23 137 lb (62.1 kg)  05/10/23 134 lb (60.8 kg)  04/03/23 137 lb (62.1 kg)    Physical Exam Vitals and nursing note reviewed.  Constitutional:      General: She is not in acute distress.    Appearance: She is well-developed. She is not diaphoretic.     Comments: Well-appearing, comfortable, cooperative  HENT:     Head: Normocephalic and atraumatic.     Right Ear: Tympanic membrane, ear canal and external ear normal. There is no impacted cerumen.     Left Ear: Tympanic membrane, ear canal and external ear normal. There is no impacted cerumen.     Nose: No congestion.     Mouth/Throat:     Mouth: Mucous membranes are moist.     Pharynx: No oropharyngeal exudate or posterior oropharyngeal erythema.     Comments: Hoarse voice Eyes:      General:        Right eye: No discharge.        Left eye: No discharge.     Conjunctiva/sclera: Conjunctivae normal.  Neck:     Thyroid : No thyromegaly.  Cardiovascular:     Rate and Rhythm: Normal rate and regular rhythm.     Heart sounds: Normal heart sounds. No murmur heard. Pulmonary:     Effort: Pulmonary effort is normal. No respiratory distress.     Breath sounds: No wheezing or rales.     Comments: Cough. Coarse breath sound R side Musculoskeletal:        General: Normal range of motion.     Cervical back: Normal range of motion and neck supple.  Lymphadenopathy:     Cervical: No cervical adenopathy.  Skin:    General: Skin is warm and dry.     Findings: No erythema or rash.  Neurological:     Mental Status: She is alert and oriented to person, place, and time.  Psychiatric:        Behavior: Behavior normal.     Comments: Well groomed, good eye contact, normal speech and thoughts     Results for orders placed or performed in visit on 04/03/23  TSH   Collection Time: 04/03/23  4:14 PM  Result Value Ref Range   TSH 1.21 0.40 - 4.50 mIU/L  Lipid panel   Collection Time: 04/03/23  4:14 PM  Result Value Ref Range   Cholesterol 180 <200 mg/dL   HDL 35 (L) > OR = 50 mg/dL   Triglycerides 409 (H) <150 mg/dL   LDL Cholesterol (Calc) 101 (H) mg/dL (calc)   Total CHOL/HDL Ratio 5.1 (H) <5.0 (calc)   Non-HDL Cholesterol (Calc) 145 (H) <130 mg/dL (calc)  Hemoglobin W1X   Collection Time: 04/03/23  4:14 PM  Result Value Ref Range   Hgb A1c MFr Bld 6.0 (H) <5.7 % of total Hgb   Mean Plasma Glucose 126 mg/dL   eAG (mmol/L) 7.0 mmol/L  COMPLETE METABOLIC PANEL WITH GFR   Collection Time: 04/03/23  4:14 PM  Result Value Ref Range   Glucose, Bld 84 65 - 99 mg/dL   BUN 19 7 - 25 mg/dL   Creat 9.14 7.82 - 9.56 mg/dL   eGFR 65 >  OR = 60 mL/min/1.76m2   BUN/Creatinine Ratio SEE NOTE: 6 - 22 (calc)   Sodium 142 135 - 146 mmol/L   Potassium 3.5 3.5 - 5.3 mmol/L   Chloride  105 98 - 110 mmol/L   CO2 28 20 - 32 mmol/L   Calcium 9.3 8.6 - 10.4 mg/dL   Total Protein 7.0 6.1 - 8.1 g/dL   Albumin 4.1 3.6 - 5.1 g/dL   Globulin 2.9 1.9 - 3.7 g/dL (calc)   AG Ratio 1.4 1.0 - 2.5 (calc)   Total Bilirubin 0.3 0.2 - 1.2 mg/dL   Alkaline phosphatase (APISO) 72 37 - 153 U/L   AST 21 10 - 35 U/L   ALT 36 (H) 6 - 29 U/L  CBC with Differential/Platelet   Collection Time: 04/03/23  4:14 PM  Result Value Ref Range   WBC 6.2 3.8 - 10.8 Thousand/uL   RBC 4.66 3.80 - 5.10 Million/uL   Hemoglobin 12.8 11.7 - 15.5 g/dL   HCT 16.1 09.6 - 04.5 %   MCV 83.7 80.0 - 100.0 fL   MCH 27.5 27.0 - 33.0 pg   MCHC 32.8 32.0 - 36.0 g/dL   RDW 40.9 81.1 - 91.4 %   Platelets 279 140 - 400 Thousand/uL   MPV 11.7 7.5 - 12.5 fL   Neutro Abs 3,150 1,500 - 7,800 cells/uL   Absolute Lymphocytes 2,151 850 - 3,900 cells/uL   Absolute Monocytes 639 200 - 950 cells/uL   Eosinophils Absolute 229 15 - 500 cells/uL   Basophils Absolute 31 0 - 200 cells/uL   Neutrophils Relative % 50.8 %   Total Lymphocyte 34.7 %   Monocytes Relative 10.3 %   Eosinophils Relative 3.7 %   Basophils Relative 0.5 %  VITAMIN D  25 Hydroxy (Vit-D Deficiency, Fractures)   Collection Time: 04/03/23  4:14 PM  Result Value Ref Range   Vit D, 25-Hydroxy 12 (L) 30 - 100 ng/mL      Assessment & Plan:   Problem List Items Addressed This Visit   None Visit Diagnoses       Acute bronchitis, unspecified organism    -  Primary   Relevant Medications   brompheniramine-pseudoephedrine-DM 30-2-10 MG/5ML syrup   azithromycin (ZITHROMAX Z-PAK) 250 MG tablet        Acute bronchitis Initial concern 05/10/23 for acute asthma exacerbation, without resolution after prednisone  burst x 5 day and albuterol  Now history most suggestive of bronchitis / infection. Possible walking pneumonia Persistent productive cough and nasal congestion unresponsive to prednisone  suggest infectious etiology. Absence of wheezing reduces likelihood  of asthma exacerbation.   - Prescribe azithromycin (Z-Pak) for 5 days. - Recommend Mucinex for mucus clearance. - Prescribe non-codeine cough syrup for nocturnal cough suppression. - Advise continued use of albuterol  inhaler as needed. - Consider chest X-ray if symptoms do not improve.    No orders of the defined types were placed in this encounter.   Meds ordered this encounter  Medications   brompheniramine-pseudoephedrine-DM 30-2-10 MG/5ML syrup    Sig: Take 5 mLs by mouth 4 (four) times daily as needed.    Dispense:  118 mL    Refill:  0   azithromycin (ZITHROMAX Z-PAK) 250 MG tablet    Sig: Take 2 tabs (500mg  total) on Day 1. Take 1 tab (250mg ) daily for next 4 days.    Dispense:  6 tablet    Refill:  0    Follow up plan: Return if symptoms worsen or fail to improve.  Domingo Friend, DO [facility] 05/16/2023, 4:14 PM

## 2023-06-06 ENCOUNTER — Other Ambulatory Visit: Payer: Self-pay | Admitting: Family Medicine

## 2023-06-06 DIAGNOSIS — J4520 Mild intermittent asthma, uncomplicated: Secondary | ICD-10-CM

## 2023-06-07 NOTE — Telephone Encounter (Signed)
 Requested Prescriptions  Pending Prescriptions Disp Refills   albuterol  (VENTOLIN  HFA) 108 (90 Base) MCG/ACT inhaler [Pharmacy Med Name: ALBUTEROL  HFA (VENTOLIN ) INH] 18 each 3    Sig: INHALE 1-2 PUFFS INTO THE LUNGS EVERY 4 HOURS AS NEEDED FOR WHEEZING OR SHORTNESS OF BREATH.     Pulmonology:  [facility] 2 Failed - 06/07/2023  2:22 PM      Failed - Valid encounter within last 12 months    Recent Outpatient Visits           3 weeks ago Acute bronchitis, unspecified organism   [facility] [place], Kayleen Party, DO   2 months ago Annual physical exam   [facility] Raina Bunting, DO              Passed - Last BP in normal range    BP Readings from Last 1 Encounters:  05/16/23 122/70         Passed - Last Heart Rate in normal range    Pulse Readings from Last 1 Encounters:  05/16/23 88

## 2023-06-30 LAB — HM MAMMOGRAPHY

## 2023-07-03 ENCOUNTER — Encounter: Payer: Self-pay | Admitting: Family Medicine

## 2023-07-03 ENCOUNTER — Ambulatory Visit: Admitting: Family Medicine

## 2023-07-03 VITALS — BP 124/68 | HR 84 | Ht 61.0 in | Wt 136.4 lb

## 2023-07-03 DIAGNOSIS — E559 Vitamin D deficiency, unspecified: Secondary | ICD-10-CM | POA: Diagnosis not present

## 2023-07-03 LAB — VITAMIN D 25 HYDROXY (VIT D DEFICIENCY, FRACTURES): Vit D, 25-Hydroxy: 71 ng/mL (ref 30–100)

## 2023-07-03 NOTE — Patient Instructions (Addendum)
 Thank you for coming to the office today.  Vitamin D  last result 12 back in March  Keep on Vitamin D3 5,000 unit daily, until you finish the supply  Based on your number today, I can advise if it is better to keep the 5000 or better to go down to 2000 unit  Message on mychart.   Please schedule a Follow-up Appointment to: Return if symptoms worsen or fail to improve.  If you have any other questions or concerns, please feel free to call the office or send a message through MyChart. You may also schedule an earlier appointment if necessary.  Additionally, you may be receiving a survey about your experience at our office within a few days to 1 week by e-mail or mail. We value your feedback.  Domingo Friend, DO [facility], [place]

## 2023-07-03 NOTE — Progress Notes (Unsigned)
 Subjective:    Patient ID: Regina Barber, female    DOB: 25-Aug-1960, 63 y.o.   MRN: [id number]  Regina Barber is a 63 y.o. female presenting on 07/03/2023 for Vitamin D  deficiency   HPI  Discussed the use of AI scribe software for clinical note transcription with the patient, who gave verbal consent to proceed.  History of Present Illness   Regina Barber is a 63 year old female who presents for follow-up on vitamin D  deficiency.  Vitamin D  Deficiency Her last blood test on March 17th showed a vitamin D  level of 12 ng/mL, significantly below the normal range. She has been taking over-the-counter vitamin D3 at a dose of 5000 IU daily. She notes an improvement in her energy levels and no longer experiences somnolence while driving.  She recently underwent a mammogram on June 13th at [facility] and is awaiting the results.      CHRONIC HTN: Reports no new concerns. Note no longer on Benazepril  10mg  x 2 = 20mg  Current Meds - Benazepril  10mg  daily, HCTZ 25mg  daily   Reports good compliance, took meds today. Tolerating well, w/o complaints. Denies CP, dyspnea, HA, edema, dizziness / lightheadedness   Pre-Diabetes She has a history of prediabetes with previous blood sugar levels recorded at 6.3% and 6.1% over the past two years. Her most recent level is 6.0%, indicating a slight improvement. She is not currently on any medication for this condition. Meds: never on meds Currently on ACEi Taking ASA 81 Lifestyle: - Diet (improved diet regimen) - Exercise (Walking regularly at work) Denies hypoglycemia, polyuria, visual changes, numbness or tingling.   Hyperlipidemia Controlled lipid panel. Not indicated for rx Statin therapy.       07/03/2023    2:45 PM 05/16/2023    4:01 PM 04/03/2023    3:38 PM  Depression screen PHQ 2/9  Decreased Interest 0 0 0  Down, Depressed, Hopeless 0 0 0  PHQ - 2 Score 0 0 0  Altered sleeping 0 1 0  Tired, decreased energy 0 0 0  Change in  appetite 0 0 0  Feeling bad or failure about yourself  0 0 0  Trouble concentrating 0 0 0  Moving slowly or fidgety/restless 0 0 0  Suicidal thoughts 0 0 0  PHQ-9 Score 0 1 0       07/03/2023    2:45 PM 05/16/2023    4:01 PM 04/03/2023    3:39 PM 01/07/2021    8:49 AM  GAD 7 : Generalized Anxiety Score  Nervous, Anxious, on Edge 0 0 0 0  Control/stop worrying 0 0 0 0  Worry too much - different things 0 0 0 0  Trouble relaxing 0 0 0 0  Restless 0 0 0 0  Easily annoyed or irritable 0 0 0 0  Afraid - awful might happen 0 0 0 0  Total GAD 7 Score 0 0 0 0  Anxiety Difficulty    Not difficult at all    Social History   Tobacco Use   Smoking status: Former    Current packs/day: 0.00    Average packs/day: 1 pack/day for 30.0 years (30.0 ttl pk-yrs)    Types: Cigarettes    Start date: 36    Quit date: 2008    Years since quitting: 17.4   Smokeless tobacco: Never   Tobacco comments:    Quit on own after birth of daughter  Vaping Use   Vaping status: Never Used  Substance Use Topics  Alcohol use: No   Drug use: No    Comment: Former history of substance use    Review of Systems Per HPI unless specifically indicated above     Objective:    BP 124/68 (BP Location: Left Arm, Patient Position: Sitting, Cuff Size: Normal)   Pulse 84   Ht 5' 1 (1.549 m)   Wt 136 lb 6 oz (61.9 kg)   SpO2 98%   BMI 25.77 kg/m   Wt Readings from Last 3 Encounters:  07/03/23 136 lb 6 oz (61.9 kg)  05/16/23 137 lb (62.1 kg)  05/10/23 134 lb (60.8 kg)    Physical Exam Vitals and nursing note reviewed.  Constitutional:      General: She is not in acute distress.    Appearance: Normal appearance. She is well-developed. She is not diaphoretic.     Comments: Well-appearing, comfortable, cooperative  HENT:     Head: Normocephalic and atraumatic.   Eyes:     General:        Right eye: No discharge.        Left eye: No discharge.     Conjunctiva/sclera: Conjunctivae normal.     Cardiovascular:     Rate and Rhythm: Normal rate.  Pulmonary:     Effort: Pulmonary effort is normal.   Skin:    General: Skin is warm and dry.     Findings: No erythema or rash.   Neurological:     Mental Status: She is alert and oriented to person, place, and time.   Psychiatric:        Mood and Affect: Mood normal.        Behavior: Behavior normal.        Thought Content: Thought content normal.     Comments: Well groomed, good eye contact, normal speech and thoughts     Results for orders placed or performed in visit on 07/03/23  VITAMIN D  25 Hydroxy (Vit-D Deficiency, Fractures)   Collection Time: 07/03/23  3:12 PM  Result Value Ref Range   Vit D, 25-Hydroxy 71 30 - 100 ng/mL      Assessment & Plan:   Problem List Items Addressed This Visit   None Visit Diagnoses       Vitamin D  deficiency    -  Primary   Relevant Orders   VITAMIN D  25 Hydroxy (Vit-D Deficiency, Fractures) (Completed)        Vitamin D  deficiency Vitamin D  level was 12 ng/mL in March. She has been taking vitamin D3 at 5000 IU daily for three months with improved symptoms. Plan to recheck levels to confirm improvement. - Order vitamin D  level test. - Continue vitamin D3 5000 IU daily until current supply is finished. - Adjust vitamin D3 dosage based on test results. - Communicate results via MyChart.  Prediabetes Hemoglobin A1c levels have improved from 6.3% and 6.1% to 6.0%. No immediate changes to management plan as improvement is noted.     Orders Placed This Encounter  Procedures   VITAMIN D  25 Hydroxy (Vit-D Deficiency, Fractures)    No orders of the defined types were placed in this encounter.   Follow up plan: Return if symptoms worsen or fail to improve.   Domingo Friend, DO [facility] 07/04/2023, 3:03 PM

## 2023-07-05 ENCOUNTER — Ambulatory Visit: Payer: Self-pay | Admitting: Family Medicine

## 2023-07-07 ENCOUNTER — Ambulatory Visit

## 2023-07-12 ENCOUNTER — Encounter: Payer: Self-pay | Admitting: Family Medicine

## 2023-10-16 ENCOUNTER — Other Ambulatory Visit: Payer: Self-pay | Admitting: Family Medicine

## 2023-10-16 DIAGNOSIS — R42 Dizziness and giddiness: Secondary | ICD-10-CM

## 2023-10-17 ENCOUNTER — Encounter: Payer: Self-pay | Admitting: Family Medicine

## 2023-10-17 DIAGNOSIS — R42 Dizziness and giddiness: Secondary | ICD-10-CM

## 2023-10-17 MED ORDER — MECLIZINE HCL 25 MG PO TABS: 25.0000 mg | ORAL_TABLET | Freq: Three times a day (TID) | ORAL | 2 refills | Status: AC | PRN

## 2023-10-18 NOTE — Telephone Encounter (Signed)
 Duplicate, refilled 10/17/23.  Requested Prescriptions  Pending Prescriptions Disp Refills   meclizine  (ANTIVERT ) 25 MG tablet [Pharmacy Med Name: MECLIZINE  25 MG TABLET] 30 tablet 2    Sig: TAKE 1 TABLET BY MOUTH 3 TIMES DAILY AS NEEDED FOR DIZZINESS.     Not Delegated - Gastroenterology: Antiemetics Failed - 10/18/2023 12:39 PM      Failed - This refill cannot be delegated      Passed - Valid encounter within last 6 months    Recent Outpatient Visits           3 months ago Vitamin D  deficiency   [facility] [place], Marsa PARAS, DO   5 months ago Acute bronchitis, unspecified organism   [facility] [place], Marsa PARAS, DO   6 months ago Annual physical exam   [facility] [place], Marsa PARAS, [place]

## 2024-04-03 ENCOUNTER — Encounter: Admitting: Family Medicine
